# Patient Record
Sex: Male | Born: 1970 | Race: Asian | Hispanic: No | Marital: Married | Smoking: Never smoker
Health system: Southern US, Community
[De-identification: ages and names within clinical notes are randomized; demographics above are authoritative.]

## PROBLEM LIST (undated history)

## (undated) DIAGNOSIS — R569 Unspecified convulsions: Secondary | ICD-10-CM

## (undated) DIAGNOSIS — Z8719 Personal history of other diseases of the digestive system: Secondary | ICD-10-CM

## (undated) DIAGNOSIS — I10 Essential (primary) hypertension: Secondary | ICD-10-CM

## (undated) DIAGNOSIS — K219 Gastro-esophageal reflux disease without esophagitis: Secondary | ICD-10-CM

## (undated) DIAGNOSIS — E785 Hyperlipidemia, unspecified: Secondary | ICD-10-CM

## (undated) HISTORY — DX: Personal history of other diseases of the digestive system: Z87.19

## (undated) HISTORY — DX: Hyperlipidemia, unspecified: E78.5

## (undated) HISTORY — DX: Gastro-esophageal reflux disease without esophagitis: K21.9

## (undated) HISTORY — DX: Essential (primary) hypertension: I10

## (undated) HISTORY — DX: Unspecified convulsions: R56.9

## (undated) HISTORY — PX: OTHER SURGICAL HISTORY: SHX169

---

## 1998-10-07 ENCOUNTER — Emergency Department (HOSPITAL_COMMUNITY): Admission: EM | Admit: 1998-10-07 | Discharge: 1998-10-07 | Payer: Self-pay | Admitting: *Deleted

## 2009-11-11 ENCOUNTER — Encounter: Payer: Self-pay | Admitting: Family Medicine

## 2009-11-28 ENCOUNTER — Encounter: Payer: Self-pay | Admitting: Family Medicine

## 2009-11-28 ENCOUNTER — Ambulatory Visit: Payer: Self-pay | Admitting: Family Medicine

## 2009-11-28 DIAGNOSIS — G40909 Epilepsy, unspecified, not intractable, without status epilepticus: Secondary | ICD-10-CM | POA: Insufficient documentation

## 2009-11-28 DIAGNOSIS — K219 Gastro-esophageal reflux disease without esophagitis: Secondary | ICD-10-CM | POA: Insufficient documentation

## 2009-11-28 DIAGNOSIS — Z8669 Personal history of other diseases of the nervous system and sense organs: Secondary | ICD-10-CM | POA: Insufficient documentation

## 2009-11-28 DIAGNOSIS — R05 Cough: Secondary | ICD-10-CM | POA: Insufficient documentation

## 2009-11-28 DIAGNOSIS — R059 Cough, unspecified: Secondary | ICD-10-CM | POA: Insufficient documentation

## 2009-11-29 ENCOUNTER — Encounter: Payer: Self-pay | Admitting: Family Medicine

## 2009-11-29 ENCOUNTER — Ambulatory Visit: Payer: Self-pay | Admitting: Family Medicine

## 2009-11-30 DIAGNOSIS — E785 Hyperlipidemia, unspecified: Secondary | ICD-10-CM | POA: Insufficient documentation

## 2009-11-30 LAB — CONVERTED CEMR LAB
Albumin: 4.3 g/dL (ref 3.5–5.2)
CO2: 26 meq/L (ref 19–32)
Chloride: 105 meq/L (ref 96–112)
Cholesterol: 212 mg/dL — ABNORMAL HIGH (ref 0–200)
Glucose, Bld: 97 mg/dL (ref 70–99)
HCV Ab: NEGATIVE
LDL Cholesterol: 144 mg/dL — ABNORMAL HIGH (ref 0–99)
Potassium: 4.4 meq/L (ref 3.5–5.3)
RBC: 4.79 M/uL (ref 4.22–5.81)
Sodium: 141 meq/L (ref 135–145)
Total Protein: 7 g/dL (ref 6.0–8.3)
Triglycerides: 124 mg/dL (ref <150)
WBC: 4 10*3/uL (ref 4.0–10.5)

## 2009-12-12 ENCOUNTER — Encounter: Payer: Self-pay | Admitting: Family Medicine

## 2009-12-14 ENCOUNTER — Encounter: Admission: RE | Admit: 2009-12-14 | Discharge: 2009-12-14 | Payer: Self-pay | Admitting: Family Medicine

## 2010-01-17 ENCOUNTER — Ambulatory Visit: Payer: Self-pay | Admitting: Family Medicine

## 2010-03-28 NOTE — Miscellaneous (Signed)
Summary: New Pt Info   Clinical Lists Changes  Observations: Added new observation of SOCIAL HX: Lives in [place]. Married.  Lives with _____  Education: High School Grad Employment: Nail Tech, full time  Tobacco: never Etoh: sometimes beer on weekend Drugs: never Exercise: 3-4x/week, walking   Cell: [phone] (11/11/2009 12:36) Added new observation of FAMILY HX: Father: died at age 39 from __ Mother: age 52,  Siblings: Cancer: (11/11/2009 12:36)        Family History: Father: died at age 62 from __ Mother: age 59,  Siblings: Cancer:  Social History: Lives in [place]. Married.  Lives with _____  Education: High School Grad Employment: Nail Tech, full time  Tobacco: never Etoh: sometimes beer on weekend Drugs: never Exercise: 3-4x/week, walking   Cell: [phone]

## 2010-03-28 NOTE — Assessment & Plan Note (Signed)
Summary: NP,tcb   Vital Signs:  Patient profile:   40 year old male Height:      64 inches Weight:      131.8 pounds BMI:     22.71 Temp:     98.5 degrees F oral Pulse rate:   84 / minute Pulse rhythm:   regular BP sitting:   113 / 81  (right arm) Cuff size:   regular  Vitals Entered By: Loralee Pacas CMA 12-22-09 9:17 AM) CC: new patient   Primary Care Provider:  Angeline Slim MD  CC:  new patient.  History of Present Illness: 40 y/o M here for new patient exam.  He is concerned about Hepatitis because his father died in 2005/07/31 from fulminant hepatitis.  Pt would like to be tested for hepatitis.    Seizure DO: 02/2007, 05/2007  two episodes of seizure.  Was treated Keppra initially but not tolerated so switched to Lamictal.  He was seeing a neurologist in [facility], but transfered to Dr Terrace Arabia when that neurologist retired.  He has been seeing Dr Terrace Arabia, who stopped his Lamictal Aug 2011.   Pt states that at time of seizure he was told the cause was due to being tired, lack of sleep, and working too hard.  REFLUX: has feelings of burning sensation in middle chest.  Lots of burping.  Worse with spicy food.  Sometimes has acid taste in mouth.  No weight loss.    Cough: chronic.  On and off for yrs.  Non productive cough.  Non weight loss, night sweat, hemoptysis, fever, chills. Nonsmoker.  Not on any meds.      Habits & Providers  Alcohol-Tobacco-Diet     Alcohol drinks/day: <1     Tobacco Status: never  Exercise-Depression-Behavior     Does Patient Exercise: yes     Type of exercise: walking     Times/week: 4     Have you felt down or hopeless? no     Have you felt little pleasure in things? no     Depression Counseling: not indicated; screening negative for depression     STD Risk: never     STD Risk Counseling: not indicated-no STD risk noted     Drug Use: never  Current Medications (verified): 1)  Omeprazole 20 Mg Cpdr (Omeprazole) .Marland Kitchen.. 1 Tab By Mouth Each  Morning  Allergies (verified): No Known Drug Allergies  Past History:  Family History: Last updated: Dec 22, 2009 Father: died at age 62 from Hepatitis, CAD s/p CABG, HLD Mother: age 72, arthritis Siblings: 3 brothers, 1 sister: healthy Cancer: no   Social History: Last updated: 12/22/09 [place] emigrated to [place], 1992. Lives in [place]. Married.  Lives with wife and 2 children.    Education: [employer] Grad Employment: Nail Tech, full time  Tobacco: never Etoh: sometimes beer on weekend Drugs: never Exercise: 3-4x/week, walking   Cell: [phone]  Risk Factors: Alcohol Use: <1 (12/22/2009) Exercise: yes (12-22-09)  Risk Factors: Smoking Status: never (12-22-2009)  Past Medical History: Seizure DO   Family History: Father: died at age 65 from Hepatitis, CAD s/p CABG, HLD Mother: age 71, arthritis Siblings: 3 brothers, 1 sister: healthy Cancer: no   Social History: [place] emigrated to [place] 31-Jul[place]1992. Lives in [place]. Married.  Lives with wife and 2 children.    Education: [employer] Employment: [employer], full time  Tobacco: never Etoh: sometimes beer on weekend Drugs: never Exercise: 3-4x/week, walking   Cell: [phone] Status:  never Does Patient Exercise:  yes Drug Use:  never STD Risk:  never  Review of Systems General:  Denies chills, fever, and weight loss. ENT:  Denies hoarseness, nasal congestion, and sore throat. CV:  Denies chest pain or discomfort, fainting, fatigue, lightheadness, near fainting, and swelling of feet. Resp:  Denies chest discomfort, chest pain with inspiration, cough, coughing up blood, morning headaches, sputum productive, and wheezing. GI:  Complains of change in bowel habits; denies abdominal pain, constipation, and diarrhea. GU:  Denies decreased libido, discharge, dysuria, erectile dysfunction, genital sores, hematuria, incontinence, nocturia, urinary frequency, and urinary hesitancy. MS:   Denies joint pain, joint redness, joint swelling, loss of strength, low back pain, mid back pain, muscle aches, muscle , cramps, muscle weakness, stiffness, and thoracic pain. Derm:  Denies changes in color of skin, changes in nail beds, dryness, excessive perspiration, flushing, hair loss, insect bite(s), itching, lesion(s), poor wound healing, and rash. Neuro:  Complains of seizures. Psych:  Denies alternate hallucination ( auditory/visual), anxiety, depression, easily angered, easily tearful, irritability, mental problems, panic attacks, sense of great danger, suicidal thoughts/plans, thoughts of violence, unusual visions or sounds, and thoughts /plans of harming others. Endo:  Denies cold intolerance, excessive hunger, excessive thirst, excessive urination, heat intolerance, polyuria, and weight change. Heme:  Denies abnormal bruising, bleeding, enlarge lymph nodes, fevers, pallor, and skin discoloration. Allergy:  Denies hives or rash, itching eyes, persistent infections, seasonal allergies, and sneezing.  Physical Exam  General:  Well-developed,well-nourished,in no acute distress; alert,appropriate and cooperative throughout examination. vitals reviewed.  Head:  normocephalic and atraumatic.   Mouth:  Oral mucosa and oropharynx without lesions or exudates.   Neck:  No deformities, masses, or tenderness noted. Lungs:  Normal respiratory effort, chest expands symmetrically. Lungs are clear to auscultation, no crackles or wheezes. Heart:  Normal rate and regular rhythm. S1 and S2 normal without gallop, murmur, click, rub or other extra sounds. Abdomen:  Bowel sounds positive,abdomen soft and non-tender without masses, organomegaly or hernias noted. Msk:  normal ROM.   Pulses:  R radial normal, R dorsalis pedis normal, L radial normal, and L dorsalis pedis normal.   Extremities:  No clubbing, cyanosis, edema, or deformity noted with normal full range of motion of all joints.   Neurologic:   alert & oriented X3 and cranial nerves II-XII intact.   Skin:  Intact without suspicious lesions or rashes Cervical Nodes:  No lymphadenopathy noted Inguinal Nodes:  No significant adenopathy   Impression & Recommendations:  Problem # 1:  HEALTH MAINTENANCE EXAM (ICD-V70.0) Assessment New New patient exam.  Will check CBC, FLP, Cmet.    Problem # 2:  SEIZURE DISORDER, HX OF (ICD-V12.49) Assessment: Unchanged States that Dr Terrace Arabia d/c Lamictal in Aug.  Will get records from Dr Terrace Arabia.  Pt signed ROI.    Problem # 3:  COUGH (ICD-786.2) Assessment: New Pt is from [place].  Although cough is intermittent with no fever/chills and lung exam wnl, will get CXR to r/o TB process.    Orders: CXR- 2view (CXR)  Problem # 4:  GERD (ICD-530.81) Assessment: New Presents for months and has not tried any meds. Will try Omeprazole.  Will need to discuss GERD lifestyle changes; small meals, avoid spicy foods/etoh/caffeine, elevate bed while asleep, do not eat prior to bed.   His updated medication list for this problem includes:    Omeprazole 20 Mg Cpdr (Omeprazole) .Marland Kitchen... 1 tab by mouth each morning  Problem # 5:  CONTACT OR EXPOSURE TO  OTHER VIRAL DISEASES (ICD-V01.79) Pt is from [place].  Father died in 2005-07-13 from Hepatitis.  Will check Hepaptitis panel.  Orders: Hep Bs Ab-[facility] ([id number]) Hep Bs Ag-[facility] ([id number]) Hep C Ab-[facility] ([id number])  Complete Medication List: 1)  Omeprazole 20 Mg Cpdr (Omeprazole) .Marland Kitchen.. 1 tab by mouth each morning  Other Orders: Influenza Vaccine NON MCR (56387) CBC-[facility] (56433) Comp Met-[facility] ([id number]) Future Orders: Lipid-[facility] ([id number]) ... 12/09/2010  Patient Instructions: 1)  Please schedule a follow-up appointment in 1 month for reflux.  2)  Please make appt for cholesterol check.  This has to be fasting, so no food or drink after midnight the night before appointment. 3)  New medicine: omeprazole 20mg  daily  Prescriptions: OMEPRAZOLE 20 MG CPDR  (OMEPRAZOLE) 1 tab by mouth each morning  #30 x 3   Entered and Authorized by:   Angeline Slim MD   Signed by:   Angeline Slim MD on 11/28/2009   Method used:   Electronically to        [facility] [address] (retail)       [phone]       [place], [place]  [address]       Ph: [phone]       Fax: [phone]   RxID:   [id number]    Immunizations Administered:  Influenza Vaccine # 1:    Vaccine Type: Fluvax Non-MCR    Site: left deltoid    Mfr: GlaxoSmithKline    Dose: 0.5 ml    Route: IM    Given by: Loralee Pacas CMA    Exp. Date: 08/23/2010    Lot #: [id number]    VIS given: 09/20/09 version given November 28, 2009.  Flu Vaccine Consent Questions:    Do you have a history of severe allergic reactions to this vaccine? no    Any prior history of allergic reactions to egg and/or gelatin? no    Do you have a sensitivity to the preservative Thimersol? no    Do you have a past history of Guillan-Barre Syndrome? no    Do you currently have an acute febrile illness? no    Have you ever had a severe reaction to latex? no    Vaccine information given and explained to patient? yes

## 2010-03-28 NOTE — Miscellaneous (Signed)
Summary: Pmhx Re Seizure DO  Clinical Lists Changes  Observations: Added new observation of PAST MED HX: -History of Seizure DO (childhooold seizures age 40, seizure free until Jan 2009; one event, then another event April 2009.  Saw Dr Philis Fendt, normal MRI brain, normal EEG 06/2009.  Now seeing Dr Terrace Arabia.  Was on Lamictal 25mg , now off since seizure free for 2 yrs) (12/12/2009 8:48) Added new observation of PRIMARY MD: Worthy Boschert MD (12/12/2009 8:48)       Past History:  Past Medical History: -History of Seizure DO (childhooold seizures age 69, seizure free until Jan 2009; one event, then another event April 2009.  Saw Dr Philis Fendt, normal MRI brain, normal EEG 06/2009.  Now seeing Dr Terrace Arabia.  Was on Lamictal 25mg , now off since seizure free for 2 yrs)

## 2010-03-28 NOTE — Miscellaneous (Signed)
Summary: Release of Info  Release of Info   Imported By: Abundio Miu 12/15/2009 10:40:33  _____________________________________________________________________  External Attachment:    Type:   Image     Comment:   External Document

## 2010-03-28 NOTE — Assessment & Plan Note (Signed)
Summary: f/u,df   Vital Signs:  Patient profile:   40 year old male Weight:      125.6 pounds BMI:     21.64 Pulse rate:   80 / minute BP sitting:   114 / 74  (right arm)  Vitals Entered By: Arlyss Repress CMA, (January 17, 2010 8:51 AM) CC: f/up cholesterol meds Is Patient Diabetic? No Pain Assessment Patient in pain? no        Primary Care Alacia Rehmann:  Cat Ta MD  CC:  f/up cholesterol meds.  History of Present Illness: 40 y/o M here for f/u of   HYPERLIPIDEMIA: Med: Simva 20mg   Compliance: yes  Prob taking med: no  Muscle aches: no Abd  pain:no  GERD:  Med: Omeprazole 20mg  daily Compliance: yes Symptoms: improved since beng on the medication.  No more sensations of burning in chest, food stuck in throat, or belching. Weight loss: yes  Abd pain: no, diarrhea: no, constipation: no  chest pain:no  Weight loss: 6 lbs since last visit, 1 1/2 months Been hanging out with friends drinking, drinks 4-5 bottles of beer each night, about 3-4 times per week.  Pt would come home and go to bed vs eat dinner.  Wife states that he likes to be in company of his friends, so when they drink he drinks also.   Not eating much since he is drinking more frequently.   No SI/HI.  Depression screening questions negative.   No change in bowel habits, no melena, hemoptysis.      .  Habits & Providers  Alcohol-Tobacco-Diet     Tobacco Status: never  Current Medications (verified): 1)  Omeprazole 20 Mg Cpdr (Omeprazole) .Marland Kitchen.. 1 Tab By Mouth Each Morning 2)  Simvastatin 20 Mg Tabs (Simvastatin) .Marland Kitchen.. 1 Tab By Mouth At Bedtime For Cholesterol  Allergies (verified): No Known Drug Allergies  Past History:  Family History: Last updated: December 13, 2009 Father: died at age 35 from Hepatitis, CAD s/p CABG, HLD Mother: age 56, arthritis Siblings: 3 brothers, 1 sister: healthy Cancer: no   Social History: Last updated: 12-13-09 [place] emigrated to [place]. Lives in [place].  Married.  Lives with wife and 2 children.    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  Saw Dr Philis Fendt, normal MRI brain, normal EEG 06/2009.  Now seeing Dr Terrace Arabia.  Was on Lamictal 25mg , now off since seizure free for 2 yrs) -HLD -Reflux  Review of Systems       per hpi   Physical Exam  General:  Well-developed,well-nourished,in no acute distress; alert,appropriate and cooperative throughout examination. vital reviewed Lungs:  Normal respiratory effort, chest expands symmetrically. Lungs are clear to auscultation, no crackles or wheezes. Heart:  Normal rate and regular rhythm. S1 and S2 normal without gallop, murmur, click, rub or other extra sounds. Abdomen:  Bowel sounds positive,abdomen soft and non-tender without masses, organomegaly or hernias noted. Neurologic:  alert & oriented X3.   Psych:  Cognition and judgment appear intact. Alert and cooperative with normal attention span and concentration. No apparent delusions, illusions, hallucinationsnormally interactive, good eye contact, not anxious appearing, not depressed appearing, not agitated, not  suicidal, and not homicidal.     Impression & Recommendations:  Problem # 1:  HYPERLIPIDEMIA (ICD-272.4) Assessment Unchanged  Taking Simva 20mg  without any problems.  Will continue on this dose.  Recheck FLP in one year.    His updated medication list for this problem includes:    Simvastatin 20 Mg Tabs (Simvastatin) .Marland Kitchen... 1 tab by mouth at bedtime for cholesterol  Orders: [facility]- Est  Level 4 (91478)  Problem # 2:  GERD  (ICD-530.81) Assessment: Improved  Reflux symptoms much better.  Pt has stopped taking omeprazole 3 days ok and symptoms better.  I've refilled the medicine for pt to have when symptoms resumed.  Pt has recent weight loss (see next), but likely not related to gerd.   His updated medication list for this problem includes:    Omeprazole 20 Mg Cpdr (Omeprazole) .Marland Kitchen... 1 tab by mouth each morning  Orders: [facility]- Est  Level 4 (29562)  Problem # 3:  WEIGHT LOSS, RECENT (ICD-783.21) Assessment: New  Weight loss of 6 lbs since last visit.  BMI still wnl.  From history pt has been binge drinking with friends and not eating regular meals.  No abd pain, diarrhea, vomiting, nausea.  Cmet, CBC and Hep panel from Oct negative.  CAGE questions negative.  Discussed decreasing alcohol consumption.  Pt to monitor weight and to rtc in 1 month if weight loss continues.  Discussed increased risks of cancer given that pt has GERD and consumes moderate amt of alcohol.    Orders: [facility]- Est  Level 4 (13086)  Complete Medication List: 1)  Omeprazole 20 Mg Cpdr (Omeprazole) .Marland Kitchen.. 1 tab by mouth each morning 2)  Simvastatin 20 Mg Tabs (Simvastatin) .Marland Kitchen.. 1 tab by mouth at bedtime for cholesterol Prescriptions: OMEPRAZOLE 20 MG CPDR (OMEPRAZOLE) 1 tab by mouth each morning  #30 x 6   Entered and Authorized by:   Angeline Slim MD   Signed by:   Angeline Slim MD on 01/17/2010   Method used:   Electronically to        [facility] [address] (retail)       [phone]       [place], [place]  [address]       Ph: [phone]       Fax: [phone]   RxID:   [id number]    Orders Added: 1)  [facility]- Est  Level 4 [43329]

## 2011-01-02 ENCOUNTER — Encounter: Payer: Self-pay | Admitting: Emergency Medicine

## 2011-01-02 ENCOUNTER — Ambulatory Visit (INDEPENDENT_AMBULATORY_CARE_PROVIDER_SITE_OTHER): Payer: BC Managed Care – PPO | Admitting: Emergency Medicine

## 2011-01-02 VITALS — BP 110/70 | HR 72 | Ht 64.0 in | Wt 132.5 lb

## 2011-01-02 DIAGNOSIS — E785 Hyperlipidemia, unspecified: Secondary | ICD-10-CM

## 2011-01-02 DIAGNOSIS — K219 Gastro-esophageal reflux disease without esophagitis: Secondary | ICD-10-CM

## 2011-01-02 DIAGNOSIS — Z23 Encounter for immunization: Secondary | ICD-10-CM

## 2011-01-02 DIAGNOSIS — Z0000 Encounter for general adult medical examination without abnormal findings: Secondary | ICD-10-CM | POA: Insufficient documentation

## 2011-01-02 MED ORDER — OMEPRAZOLE 20 MG PO CPDR: 20.0000 mg | DELAYED_RELEASE_CAPSULE | Freq: Every day | ORAL | Status: DC | PRN

## 2011-01-02 NOTE — Assessment & Plan Note (Signed)
Will get flu shot today.  Will also check LFTs given his concern for liver disease after his father's liver cancer.

## 2011-01-02 NOTE — Assessment & Plan Note (Signed)
Has not been taking simvastatin for the last 6 months.  Given last years lipid panel with LDL of 144 and no risk factors, I will discontinue the simvastatin for now.  A fasting lipid panel and has been ordered and he will have that drawn this week.

## 2011-01-02 NOTE — Progress Notes (Signed)
  Subjective:    Patient ID: Logan Black, male    DOB: 1970/07/30, 40 y.o.   MRN: [id number]  HPI Logan Black is a healthy 40 year old man who comes in today for a physical exam. He has no acute concerns today or symptoms.  He does have a history of heartburn for which he takes omeprazole as needed. He is currently asymptomatic however would like a prescription available should he develop symptoms.  Last year he was diagnosed with hyperlipidemia based on a fasting lipid panel total cholesterol 212 and LDL of 144. At that time he was started on simvastatin 20 mg daily. He has not been taking this medication for the last 6 months. He has no history of hypertension, coronary artery disease, and there is no family history of coronary artery disease.  He does have some concerns about his liver due to the fact that his father recently died from liver cancer. He would like his liver checked today.   Review of Systems Please see history of present illness otherwise review of systems is negative.    Objective:   Physical Exam Filed Vitals:   01/02/11 1325  BP: 110/70  Pulse: 72   General: Awake, alert, well-appearing, no distress HEENT: Sclera are white, moist mucous membranes, no cervical lymphadenopathy CV: Regular rate and rhythm, no murmurs, good peripheral pulses Pulm: Clear to auscultation bilaterally Abdomen: Positive bowel sounds, soft, nondistended, nontender, no hepatosplenomegaly Extremities: No peripheral edema Neuro: Normal gait      Assessment & Plan:

## 2011-01-02 NOTE — Assessment & Plan Note (Signed)
Currently asymptomatic; however would like to have a prescription available in case he develops symptoms. Will refill his omeprazole.

## 2011-01-02 NOTE — Patient Instructions (Signed)
He was very nice to meet you!  Her physical exam today was completely normal and you are in excellent health. I am going to have you return for a lab appointment this week to check your liver function and your fasting lipid panel. Please do not eat or drink anything other than water for 8 hours prior to your lab appointment. You can schedule the lab appointment for any morning this week on your way out.  I am going to stop the simvastatin. If your cholesterol is still elevated, I will contact you about possibly restarting the medicine. You should receive a letter in the next 2-4 weeks with the lab results. If he do not receive a letter please call the clinic at [phone] for your results.  Please follow up with me in one year for an annual exam or sooner as needed.

## 2011-01-05 ENCOUNTER — Other Ambulatory Visit: Payer: BC Managed Care – PPO

## 2011-01-05 DIAGNOSIS — E785 Hyperlipidemia, unspecified: Secondary | ICD-10-CM

## 2011-01-05 LAB — HEPATIC FUNCTION PANEL
ALT: 20 U/L (ref 0–53)
AST: 19 U/L (ref 0–37)
Bilirubin, Direct: 0.1 mg/dL (ref 0.0–0.3)

## 2011-01-05 LAB — LIPID PANEL
Cholesterol: 193 mg/dL (ref 0–200)
HDL: 40 mg/dL (ref >39)
Total CHOL/HDL Ratio: 4.8 Ratio

## 2011-01-05 NOTE — Progress Notes (Signed)
Labs done today Logan Black 

## 2011-01-08 ENCOUNTER — Encounter: Payer: Self-pay | Admitting: Emergency Medicine

## 2012-01-02 ENCOUNTER — Ambulatory Visit (INDEPENDENT_AMBULATORY_CARE_PROVIDER_SITE_OTHER): Payer: BC Managed Care – PPO | Admitting: Emergency Medicine

## 2012-01-02 ENCOUNTER — Encounter: Payer: Self-pay | Admitting: Emergency Medicine

## 2012-01-02 VITALS — BP 123/83 | HR 78 | Ht 64.0 in | Wt 134.0 lb

## 2012-01-02 DIAGNOSIS — K219 Gastro-esophageal reflux disease without esophagitis: Secondary | ICD-10-CM

## 2012-01-02 DIAGNOSIS — Z23 Encounter for immunization: Secondary | ICD-10-CM

## 2012-01-02 DIAGNOSIS — Z0000 Encounter for general adult medical examination without abnormal findings: Secondary | ICD-10-CM

## 2012-01-02 LAB — CBC
HCT: 43.2 % (ref 39.0–52.0)
Hemoglobin: 14.5 g/dL (ref 13.0–17.0)
MCH: 30 pg (ref 26.0–34.0)
MCV: 89.3 fL (ref 78.0–100.0)
RBC: 4.84 MIL/uL (ref 4.22–5.81)

## 2012-01-02 LAB — COMPREHENSIVE METABOLIC PANEL
Alkaline Phosphatase: 53 U/L (ref 39–117)
Creat: 0.96 mg/dL (ref 0.50–1.35)
Glucose, Bld: 79 mg/dL (ref 70–99)
Sodium: 140 mEq/L (ref 135–145)
Total Bilirubin: 0.4 mg/dL (ref 0.3–1.2)
Total Protein: 6.8 g/dL (ref 6.0–8.3)

## 2012-01-02 MED ORDER — OMEPRAZOLE 20 MG PO CPDR: 20.0000 mg | DELAYED_RELEASE_CAPSULE | Freq: Every day | ORAL | Status: DC | PRN

## 2012-01-02 NOTE — Assessment & Plan Note (Signed)
Intermittent heartburn.  Will refill omeprazole to take as needed.

## 2012-01-02 NOTE — Patient Instructions (Addendum)
It was nice to see you! We are going to check some labs today.  I will send a letter with the results in the next 2 weeks. Follow up in 1 year for annual exam or sooner if needed.

## 2012-01-02 NOTE — Addendum Note (Signed)
Addended by: Jennette Bill on: 01/02/2012 10:49 AM   Modules accepted: Orders

## 2012-01-02 NOTE — Assessment & Plan Note (Signed)
Will get flu and TDaP today.  BP, LDL, glucose, BMI all at goal.  Discussed getting 30 minutes of exercise 3x per week.  Follow up in 1 year for annual exam.

## 2012-01-02 NOTE — Progress Notes (Signed)
  Subjective:    Patient ID: Logan Black, male    DOB: 27-Jun-1970, 41 y.o.   MRN: [id number]  HPI Logan Black is here for annual exam and lab work.  He has no acute concerns today.  Would like the flu shot and labs to assess his liver function.  Eating a healthy diet with fruits and vegetables.  Does not exercise.  I have reviewed and updated the following as appropriate: allergies, current medications, past family history, past medical history, past social history, past surgical history and problem list PMHx: removed hyperlipidemia as cholesterol is normal  FHx: liver cancer in father SHx: never smoker   Review of Systems Patient Information Form: Screening and ROS  Do you feel safe in relationships? yes PHQ-2:negative  Review of Symptoms  General:  Negative for nexplained weight loss, fever Skin: Negative for new or changing mole, sore that won't heal HEENT: Negative for trouble hearing, trouble seeing, ringing in ears, mouth sores, hoarseness, change in voice, dysphagia. CV:  Negative for chest pain, dyspnea, edema, palpitations Resp: Negative for cough, dyspnea, hemoptysis GI: Negative for nausea, vomiting, diarrhea, constipation, abdominal pain, melena, hematochezia. GU: Negative for dysuria, incontinence, urinary hesitance, hematuria, vaginal or penile discharge, polyuria, sexual difficulty, lumps in testicle or breasts MSK: Negative for muscle cramps or aches, joint pain or swelling Neuro: Negative for headaches, weakness, numbness, dizziness, passing out/fainting Psych: Negative for depression, anxiety, memory problems      Objective:   Physical Exam BP 123/83  Pulse 78  Ht 5\' 4"  (1.626 m)  Wt 134 lb (60.782 kg)  BMI 23.00 kg/m2 Gen: alert, cooperative, NAD HEENT: AT/[place], sclera white, PERRL, EOMI, MMM, no pharyngeal erythema or exudate, external ears normal Neck: supple, no LAD, trachea midline CV: RRR, no murmurs Pulm: CTAB, no wheezes or rales Ext: no edema,  WWP Neuro: alert, oriented, strength 5/5 in all extremities, gait normal Skin: no rashes or lesions      Assessment & Plan:

## 2012-01-04 ENCOUNTER — Encounter: Payer: Self-pay | Admitting: Emergency Medicine

## 2012-10-23 ENCOUNTER — Encounter: Payer: Self-pay | Admitting: Family Medicine

## 2012-10-23 ENCOUNTER — Ambulatory Visit (INDEPENDENT_AMBULATORY_CARE_PROVIDER_SITE_OTHER): Payer: BC Managed Care – PPO | Admitting: Family Medicine

## 2012-10-23 VITALS — BP 131/89 | HR 84 | Temp 97.6°F | Ht 64.0 in | Wt 130.0 lb

## 2012-10-23 DIAGNOSIS — L02414 Cutaneous abscess of left upper limb: Secondary | ICD-10-CM

## 2012-10-23 DIAGNOSIS — IMO0002 Reserved for concepts with insufficient information to code with codable children: Secondary | ICD-10-CM

## 2012-10-23 MED ORDER — OXYCODONE-ACETAMINOPHEN 5-325 MG PO TABS: 1.0000 | ORAL_TABLET | ORAL | Status: DC | PRN

## 2012-10-23 MED ORDER — CLINDAMYCIN HCL 300 MG PO CAPS: 300.0000 mg | ORAL_CAPSULE | Freq: Three times a day (TID) | ORAL | Status: DC

## 2012-10-23 NOTE — Patient Instructions (Signed)
Abscess An abscess is an infected area that contains a collection of pus and debris.It can occur in almost any part of the body. An abscess is also known as a furuncle or boil. CAUSES  An abscess occurs when tissue gets infected. This can occur from blockage of oil or sweat glands, infection of hair follicles, or a minor injury to the skin. As the body tries to fight the infection, pus collects in the area and creates pressure under the skin. This pressure causes pain. People with weakened immune systems have difficulty fighting infections and get certain abscesses more often.  SYMPTOMS Usually an abscess develops on the skin and becomes a painful mass that is red, warm, and tender. If the abscess forms under the skin, you may feel a moveable soft area under the skin. Some abscesses break open (rupture) on their own, but most will continue to get worse without care. The infection can spread deeper into the body and eventually into the bloodstream, causing you to feel ill.  DIAGNOSIS  Your caregiver will take your medical history and perform a physical exam. A sample of fluid may also be taken from the abscess to determine what is causing your infection. TREATMENT  Your caregiver may prescribe antibiotic medicines to fight the infection. However, taking antibiotics alone usually does not cure an abscess. Your caregiver may need to make a small cut (incision) in the abscess to drain the pus. In some cases, gauze is packed into the abscess to reduce pain and to continue draining the area. HOME CARE INSTRUCTIONS   Only take over-the-counter or prescription medicines for pain, discomfort, or fever as directed by your caregiver.  If you were prescribed antibiotics, take them as directed. Finish them even if you start to feel better.  If gauze is used, follow your caregiver's directions for changing the gauze.  To avoid spreading the infection:  Keep your draining abscess covered with a  bandage.  Wash your hands well.  Do not share personal care items, towels, or whirlpools with others.  Avoid skin contact with others.  Keep your skin and clothes clean around the abscess.  Keep all follow-up appointments as directed by your caregiver. SEEK MEDICAL CARE IF:   You have increased pain, swelling, redness, fluid drainage, or bleeding.  You have muscle aches, chills, or a general ill feeling.  You have a fever. MAKE SURE YOU:   Understand these instructions.  Will watch your condition.  Will get help right away if you are not doing well or get worse. Document Released: 11/22/2004 Document Revised: 08/14/2011 Document Reviewed: 04/27/2011 [employer] Patient Information 2014 [employer].  

## 2012-10-23 NOTE — Progress Notes (Addendum)
  Subjective:    Patient ID: Logan Black, male    DOB: 1971-01-03, 42 y.o.   MRN: [id number]  HPI 42 y.o. M presents with 4 days of worsening pain on right arm following a sting from a hornet. Pt states that since that time he has had worsening pain but no fevers. Wife attempted to drain yesterday but was unable to get anything more than blood out from wound.   No fevers, weakness, numbness or other concerning symptoms at this time.   Review of Systems  Constitutional: Negative for activity change and appetite change.  Respiratory: Negative for cough.   Cardiovascular: Negative for chest pain.  Skin: Positive for color change and wound. Negative for pallor and rash.  Neurological: Negative for headaches.       Objective:   Physical Exam  Constitutional: He appears well-developed and well-nourished. No distress.  Skin: Skin is warm and dry. Rash (large fluctuant edematous lesion on right upper arm. ) noted. He is not diaphoretic. There is erythema. No pallor.   Left upper arm: erythematous fluctuant abscess TTP, no active drainag       Assessment & Plan:  Skin abscess 2/2 insect bite - I&D in clinic - Clindamycin 300mg  TID for 10days for MRSA coverage - f/u for wound packing and in 7-10 days to ensure resolution.  Procedure: Incision and drainage of abscess Consent: The risks and benefits of the procedure were discussed with the patient. Risks include acute and chronic pain, spread of infection or new infection (minimized by sterile technique), bleeding (internal and external), and need for subsequent procedure to correct a poor cosmetic result or to further treat symptoms. Rarely, damage to internal structures may occur requiring subsequent procedures or, exceedingly rare, long term complications. Benefits may include symptom relief, evacuation of infection, and potentially diagnosis. The alternatives were explained to the patient including: not doing procedure or postponing  procedure. The disadvantages to not doing the procedure were discussed with the patent, including the persistence of the abscess or failure to determine the species of bacteria. The written consent form has been signed and placed in the patient's medical record The patient voiced understanding of the procedure and agreed to proceed. Indication: Presence of abscess Physicians: Dr. Jolyn Lent, MD and [facility] Staff Description in detail: A timeout was completed before the start of the procedure - the site was  verified and documented in the chart. Injected 10  mL of 1% Lidocaine with epinephrine into abscess area. With 11 blade, made elliptical excision and removed small amount of skin. Drained 5mL of purulent fluid. Probed wound for loculations. Irrigated wound w/ 30 mL of normal saline. Packed wound w/ iodoform gauze. Dressing placed. EBL: Minimal; <57mL Complications: None Instructions to patient: Return to clinic vs. ER, depending on severity, with fevers,  erythema, increasing pain, or reacculumulation of purulent material. Expect mild soreness for at least 3-5 days.   Call clinic with other questions. Follow up plan: Pt to pack at home, to follow up in 7-10days in clinic to ensure resolution  Tawana Scale, MD OB Fellow  Antibiotic Organism Organism Organism METHICILLIN RESISTANT STAPHYLOCOCCUS AUREUS CEFAZOLIN R CIPROFLOXACIN <=0.5 S Final CLINDAMYCIN <=0.25 S Final ERYTHROMYCIN <=0.25 S Final GENTAMICIN <=0.5 S Final LEVOFLOXACIN 0.25 S Final LINEZOLID 2 S Final OXACILLIN >=4 R Final PENICILLIN >=0.5 R Final RIFAMPIN <=0.5 S Final TETRACYCLINE <=1 S Final TRIMETH/SULFA <=10 S Final VANCOMYCIN <=0.5 S Final

## 2012-10-24 ENCOUNTER — Encounter: Payer: Self-pay | Admitting: Family Medicine

## 2012-10-24 ENCOUNTER — Ambulatory Visit (INDEPENDENT_AMBULATORY_CARE_PROVIDER_SITE_OTHER): Payer: BC Managed Care – PPO | Admitting: Family Medicine

## 2012-10-24 VITALS — BP 114/90 | HR 109 | Temp 98.3°F | Wt 130.4 lb

## 2012-10-24 DIAGNOSIS — IMO0002 Reserved for concepts with insufficient information to code with codable children: Secondary | ICD-10-CM

## 2012-10-24 NOTE — Assessment & Plan Note (Signed)
Less than 24 hours s/p I&D. Packing not removed since it has only been one day. Area re-dressed with nonadherent gauze and coban. Continue clinda. F/u on Sept 2 for recheck. If he has increased pain, redness, drainage, odor or if he feels ill, he should be seen over the weekend. Pt agrees.

## 2012-10-24 NOTE — Patient Instructions (Addendum)
It was good to see you. I am glad your arm is a little better.  Keep taking your Clindamycin (antibiotics) THREE TIMES per day. You can take the Percocet as needed for pain.  Please make an appointment on Tuesday to have the packing changed.  Amyri Frenz M. Ugochi Henzler, M.D.

## 2012-10-24 NOTE — Progress Notes (Signed)
Patient ID: Logan Black, male   DOB: 02-08-1971, 42 y.o.   MRN: [id number]  [facility] Logan Alen M. Caydence Koenig, MD Phone: [phone]   Subjective: HPI: Patient is a 42 y.o. male presenting to clinic today for wound check for I&D and dressing change  Had I&D done yesterday for abscess of arm. Started Clinda last night. Only concern is some increased pain towards elbow. He denies any odor or excessive bleeding. No other concerns.  History Reviewed: Non smoker.  ROS: Please see HPI above.  Objective: Office vital signs reviewed. BP 114/90  Pulse 109  Temp(Src) 98.3 F (36.8 C) (Oral)  Wt 130 lb 6.4 oz (59.149 kg)  BMI 22.37 kg/m2  Physical Examination:  General: Awake, alert. NAD HEENT: Atraumatic, normocephalic Extremities: Large ABD pain covering arm with tape. Erythema where tape was removed. 4cm erythema surrounding open/packed wound on anterior aspect of right arm proximal to antecubital fossa. Mild induration of the area. Packing has bloody drainage but no pus. No red streaking of skin. Neuro: Grossly intact  Assessment: 42 y.o. male I&D check  Plan: See Problem List and After Visit Summary

## 2012-10-26 LAB — WOUND CULTURE

## 2012-10-28 ENCOUNTER — Ambulatory Visit (INDEPENDENT_AMBULATORY_CARE_PROVIDER_SITE_OTHER): Payer: BC Managed Care – PPO | Admitting: Family Medicine

## 2012-10-28 ENCOUNTER — Encounter: Payer: Self-pay | Admitting: Family Medicine

## 2012-10-28 VITALS — BP 122/86 | HR 80 | Temp 99.5°F | Wt 130.0 lb

## 2012-10-28 DIAGNOSIS — IMO0002 Reserved for concepts with insufficient information to code with codable children: Secondary | ICD-10-CM

## 2012-10-28 NOTE — Assessment & Plan Note (Signed)
Still with some purulent drainage when packing removed. Will repack today and f/u in 48 hours to have it removed. If no more pus, can leave open and do routine wound care.

## 2012-10-28 NOTE — Progress Notes (Signed)
Patient ID: Mare Ludtke, male   DOB: Apr 07, 1970, 42 y.o.   MRN: [id number]  [name] Graciano Batson M. Lesleyann Fichter, MD Phone: [phone]   Subjective: HPI: Patient is a 42 y.o. male presenting to clinic today for wound check.  Was seen last week for abscess of right upper arm. S/p I&D with packing. Overall doing well. No pain, no odor, no drainage. Changing dressing regularly. Does report some itching (?tape allergies)  History Reviewed: Non-smoker.  ROS: Please see HPI above.  Objective: Office vital signs reviewed. BP 122/86  Pulse 80  Temp(Src) 99.5 F (37.5 C) (Oral)  Wt 130 lb (58.968 kg)  BMI 22.3 kg/m2  Physical Examination:  General: Awake, alert. NAD.  HEENT: Atraumatic, normocephalic. MMM Extremities: No edema. Well healing open wound on right upper extremity. 1cm elliptical incision with small amount of purulent drainage and good granulation tissue Neuro: Grossly intact  Assessment: 42 y.o. male wound check  Plan: See Problem List and After Visit Summary

## 2012-10-28 NOTE — Patient Instructions (Addendum)
I am glad you are feeling better.  We will see you back on Thursday to have the packing removed. Continue taking your antibiotics.  If you have any increased pain, redness, swelling or fever please let us know.  Semisi Biela M. Dajuana Palen, M.D.

## 2012-10-30 ENCOUNTER — Ambulatory Visit (INDEPENDENT_AMBULATORY_CARE_PROVIDER_SITE_OTHER): Payer: BC Managed Care – PPO | Admitting: Family Medicine

## 2012-10-30 ENCOUNTER — Encounter: Payer: Self-pay | Admitting: Family Medicine

## 2012-10-30 VITALS — BP 123/85 | HR 106 | Temp 98.7°F | Wt 131.0 lb

## 2012-10-30 DIAGNOSIS — IMO0002 Reserved for concepts with insufficient information to code with codable children: Secondary | ICD-10-CM

## 2012-10-30 NOTE — Patient Instructions (Addendum)
Incision and Drainage Care After Refer to this sheet in the next few weeks. These instructions provide you with information on caring for yourself after your procedure. Your caregiver may also give you more specific instructions. Your treatment has been planned according to current medical practices, but problems sometimes occur. Call your caregiver if you have any problems or questions after your procedure. HOME CARE INSTRUCTIONS   Finish your antibiotics  Let warm water wash over your arm twice daily in the shower. Avoid soaps and lotions to the area.  Only take over-the-counter or prescription medicines for pain, discomfort, or fever as directed by your caregiver.  Keep all follow-up appointments as directed by your caregiver. (We will see you back on Sept 15 for a recheck, or sooner if you have any problems.)  Change any bandages (dressings) as directed by your caregiver. Replace old dressings with clean dressings.  Wash your hands before and after caring for your wound. You will receive specific instructions for cleansing and caring for your wound.  SEEK MEDICAL CARE IF:   You have increased pain, swelling, or redness around the wound.  You have increased drainage, smell, or bleeding from the wound.  You have muscle aches, chills, or you feel generally sick.  You have a fever. MAKE SURE YOU:   Understand these instructions.  Will watch your condition.  Will get help right away if you are not doing well or get worse. Document Released: 05/07/2011 Document Reviewed: 05/07/2011 [facility] Patient Information 2014 [place], [place].

## 2012-10-30 NOTE — Assessment & Plan Note (Signed)
Resolving. Continue Clinda. Wound left open to heal by secondary intent today. F/u in 10 days or sooner if needed.

## 2012-10-30 NOTE — Progress Notes (Signed)
Patient ID: Logan Black, male   DOB: 1971/02/27, 42 y.o.   MRN: [id number]  [name] Amber M. Hairford, MD Phone: [phone]   Subjective: HPI: Patient is a 42 y.o. male presenting to clinic today for wound check.  Patient is s/p I&D with repeat packing 48 hours ago. He reports no pain or odor, but does have itching around the wound. Still taking antibiotics, but almost finished. No complaints today.  History Reviewed: Non smoker.  ROS: Please see HPI above.  Objective: Office vital signs reviewed. BP 123/85  Pulse 106  Temp(Src) 98.7 F (37.1 C) (Oral)  Wt 131 lb (59.421 kg)  BMI 22.47 kg/m2  Physical Examination:  General: Awake, alert. NAD Extremities: No edema. 1cm open wound on upper right arm proximal to elbow. Packing without purulent drainage. Base of wound with granulation tissue. No surrounding erythema.  Neuro: Grossly intact  Assessment: 42 y.o. male wound recheck s/p I&D  Plan: See Problem List and After Visit Summary

## 2012-11-11 ENCOUNTER — Ambulatory Visit: Payer: BC Managed Care – PPO | Admitting: Family Medicine

## 2012-11-27 ENCOUNTER — Encounter: Payer: Self-pay | Admitting: Emergency Medicine

## 2012-11-27 ENCOUNTER — Ambulatory Visit (INDEPENDENT_AMBULATORY_CARE_PROVIDER_SITE_OTHER): Payer: BC Managed Care – PPO | Admitting: Emergency Medicine

## 2012-11-27 VITALS — BP 121/85 | HR 73 | Ht 64.0 in | Wt 131.6 lb

## 2012-11-27 DIAGNOSIS — R197 Diarrhea, unspecified: Secondary | ICD-10-CM | POA: Insufficient documentation

## 2012-11-27 DIAGNOSIS — Z0000 Encounter for general adult medical examination without abnormal findings: Secondary | ICD-10-CM | POA: Insufficient documentation

## 2012-11-27 LAB — COMPREHENSIVE METABOLIC PANEL
ALT: 23 U/L (ref 0–53)
AST: 22 U/L (ref 0–37)
Albumin: 4.2 g/dL (ref 3.5–5.2)
Alkaline Phosphatase: 51 U/L (ref 39–117)
Calcium: 9.8 mg/dL (ref 8.4–10.5)
Chloride: 104 mEq/L (ref 96–112)
Potassium: 4.3 mEq/L (ref 3.5–5.3)
Sodium: 139 mEq/L (ref 135–145)

## 2012-11-27 LAB — LIPID PANEL: LDL Cholesterol: 130 mg/dL — ABNORMAL HIGH (ref 0–99)

## 2012-11-27 MED ORDER — PSYLLIUM 30.9 % PO POWD: 1.0000 [IU] | Freq: Every day | ORAL | Status: DC

## 2012-11-27 NOTE — Progress Notes (Signed)
  Subjective:    Patient ID: Logan Black, male    DOB: 1971/01/02, 42 y.o.   MRN: [id number]  HPI Aslan Romanoski is here for an annual exam.  I have reviewed and updated the following as appropriate: allergies, current medications, past family history, past medical history, past social history, past surgical history and problem list PMHx: none  FHx: dad with liver cancer SHx: non smoker  Diarrhea He reports loose to watery stools 3-4x a day for the last 4-5 weeks.  He states that about 1-2 hours after eating, he needs to use the restroom.  Denies any blood or mucous in the stool.  No abdominal pain, tenesmus, nausea, vomiting.  No weight loss.     Review of Systems Patient Information Form: Screening and ROS Do you feel safe in relationships? yes PHQ-2:negative  Review of Symptoms  General:  Negative for nexplained weight loss, fever Skin: Negative for new or changing mole, sore that won't heal HEENT: Negative for trouble hearing, trouble seeing, ringing in ears, mouth sores, hoarseness, change in voice, dysphagia. CV:  Negative for chest pain, dyspnea, edema, palpitations Resp: Negative for cough, dyspnea, hemoptysis GI: Negative for nausea, vomiting, + diarrhea, constipation, abdominal pain, melena, hematochezia. GU: Negative for dysuria, incontinence, urinary hesitance, hematuria, vaginal or penile discharge, polyuria, sexual difficulty, lumps in testicle or breasts MSK: Negative for muscle cramps or aches, joint pain or swelling Neuro: Negative for headaches, weakness, numbness, dizziness, passing out/fainting Psych: Negative for depression, anxiety, memory problems      Objective:   Physical Exam BP 121/85  Pulse 73  Ht 5\' 4"  (1.626 m)  Wt 131 lb 9.6 oz (59.693 kg)  BMI 22.58 kg/m2 Gen: alert, cooperative, NAD HEENT: AT/Nenzel, sclera white, PERRL, MMM Neck: supple, no LAD CV: RRR, no murmurs Pulm: CTAB, no wheezes or rales Abd: +BS, soft, NTND Ext: no edema, 2+ DP pulses  bilaterally Neuro: no focal deficits Skin: no rashes     Assessment & Plan:

## 2012-11-27 NOTE — Assessment & Plan Note (Signed)
I suspect this is functional. No symptoms consistent with infectious or inflammatory cause. Discussed starting daily fiber with Metamucil. Follow up in 1 month if not improving on the Metamucil - would get stool leukocytes and stool culture at that time.

## 2012-11-27 NOTE — Patient Instructions (Addendum)
It was nice to see you!  Please get Metamucil at the drug store - you do not need a prescription for this.  It is a powder that you mix in a glass of water and take once a day. This may help with the diarrhea.  Things to do to Keep yourself Healthy  - Exercise at least 30-45 minutes a day,  3-4 days a week.  - Eat a low-fat diet with lots of fruits and vegetables, up to 7-9 servings per day. - Seatbelts can save your life. Wear them always. - Smoke detectors on every level of your home, check batteries every year. - Eye Doctor - have an eye exam every 1-2 years - Safe sex - if you may be exposed to STDs, use a condom. - Alcohol If you drink, do it moderately,less than 2 drinks per dayt. - Health Care Power of Attorney.  Choose someone to speak for you if you are not able. - Depression is common in our stressful world.If you're feeling down or losing interest in things you normally enjoy, please come in for a visit.  Follow up in 1 month for the diarrhea if it is not better on the metamucil.

## 2012-11-27 NOTE — Assessment & Plan Note (Signed)
Doing well. Hand out prevention given. Flu today. Will check CMP and Lipids today. Follow up in 1 year.

## 2012-11-28 ENCOUNTER — Encounter: Payer: Self-pay | Admitting: Emergency Medicine

## 2012-12-25 ENCOUNTER — Ambulatory Visit: Payer: BC Managed Care – PPO | Admitting: Emergency Medicine

## 2012-12-30 ENCOUNTER — Ambulatory Visit: Payer: BC Managed Care – PPO | Admitting: Emergency Medicine

## 2013-01-05 ENCOUNTER — Ambulatory Visit: Payer: BC Managed Care – PPO | Admitting: Emergency Medicine

## 2013-01-07 ENCOUNTER — Ambulatory Visit: Payer: BC Managed Care – PPO | Admitting: Emergency Medicine

## 2013-01-07 ENCOUNTER — Ambulatory Visit (INDEPENDENT_AMBULATORY_CARE_PROVIDER_SITE_OTHER): Payer: BC Managed Care – PPO | Admitting: Emergency Medicine

## 2013-01-07 ENCOUNTER — Encounter: Payer: Self-pay | Admitting: Emergency Medicine

## 2013-01-07 VITALS — BP 123/86 | HR 76 | Temp 98.8°F | Ht 64.0 in | Wt 130.0 lb

## 2013-01-07 DIAGNOSIS — R197 Diarrhea, unspecified: Secondary | ICD-10-CM

## 2013-01-07 MED ORDER — LOPERAMIDE HCL 2 MG PO CAPS: 2.0000 mg | ORAL_CAPSULE | ORAL | Status: DC | PRN

## 2013-01-07 NOTE — Patient Instructions (Signed)
It was nice to see you!  Your diarrhea is related to your gut moving too quickly. Keep taking the fiber supplements.  I sent in a medicine call imodium.  Take it as needed for diarrhea.  You may need it just once a day, or you may need it 2 or 3 times a day.  Follow up in 1 year for annual exam or sooner as needed.

## 2013-01-07 NOTE — Assessment & Plan Note (Signed)
History and exam benign. No improvement with increased fiber. Will start Imodium prn. Follow up as needed.

## 2013-01-07 NOTE — Progress Notes (Signed)
  Subjective:    Patient ID: Logan Black, male    DOB: 1970/10/03, 42 y.o.   MRN: [id number]  HPI Logan Black is here for f/u diarrhea.  He reports that there has been no improvement with adding fiber to his diet.  He still reports needed to use the restroom 1-2 hours after eating and having loose to watery stools.  No blood in stool or tenesmus.  No nausea, vomiting, heartburn, abdominal pain, or weight loss.  No family history of GI issues.   I have reviewed and updated the following as appropriate: allergies and current medications SHx: non smoker  Review of Systems See HPI    Objective:   Physical Exam BP 123/86  Pulse 76  Temp(Src) 98.8 F (37.1 C) (Oral)  Ht 5\' 4"  (1.626 m)  Wt 130 lb (58.968 kg)  BMI 22.30 kg/m2 Gen: alert, cooperative, NAD Abd: +BS, soft, NTND Rectal: no external hemorrhoids; normal rectal tone  FOBT: negative     Assessment & Plan:

## 2013-03-18 ENCOUNTER — Telehealth: Payer: Self-pay | Admitting: Emergency Medicine

## 2013-03-18 DIAGNOSIS — R197 Diarrhea, unspecified: Secondary | ICD-10-CM

## 2013-03-18 MED ORDER — LOPERAMIDE HCL 2 MG PO CAPS: 2.0000 mg | ORAL_CAPSULE | ORAL | Status: DC | PRN
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
## 2013-03-18 NOTE — Telephone Encounter (Signed)
[facility] prescription [facility].

## 2013-03-18 NOTE — Telephone Encounter (Signed)
Will fwd to MD.  Crestina Strike L, CMA  

## 2013-03-19 NOTE — Telephone Encounter (Signed)
Pt notified.  Dallan Schonberg L, CMA  

## 2013-11-24 ENCOUNTER — Ambulatory Visit (INDEPENDENT_AMBULATORY_CARE_PROVIDER_SITE_OTHER): Payer: BC Managed Care – PPO | Admitting: Family Medicine

## 2013-11-24 ENCOUNTER — Encounter: Payer: Self-pay | Admitting: Family Medicine

## 2013-11-24 VITALS — BP 118/82 | HR 97 | Ht 64.0 in | Wt 133.0 lb

## 2013-11-24 DIAGNOSIS — Z0000 Encounter for general adult medical examination without abnormal findings: Secondary | ICD-10-CM

## 2013-11-24 NOTE — Patient Instructions (Addendum)
It was great to meet you today!  We are getting some lab work today.  Someone will call you or send you a letter with the results soon.  Shirlee LatchAngela Eugina Row, MD (Dr. Dorothea OgleB) [facility]  Things to do to keep yourself healthy  - Exercise at least 30-45 minutes a day, 3-4 days a week.  - Eat a low-fat diet with lots of fruits and vegetables, up to 7-9 servings per day.  - Seatbelts can save your life. Wear them always.  - Smoke detectors on every level of your home, check batteries every year.  - Eye Doctor - have an eye exam every 1-2 years  - Safe sex - if you may be exposed to STDs, use a condom.  - Alcohol -  If you drink, do it moderately, less than 2 drinks per day.  - Health Care Power of Attorney. Choose someone to speak for you if you are not able.  - Depression is common in our stressful world.If you're feeling down or losing interest in things you normally enjoy, please come in for a visit.  - Violence - If anyone is threatening or hurting you, please call immediately.

## 2013-11-24 NOTE — Assessment & Plan Note (Signed)
Doing well.   UTD on flu and TDAP.  Will check Lipid panel after 10/2 (1 year from last check).  Hand out on prevention/staying healthy given.   Encouraged more exercise. F/u in 1 yr

## 2013-11-24 NOTE — Progress Notes (Signed)
   Subjective:   Logan Black is a 43 y.o. 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  I have reviewed and updated the following as appropriate: allergies, current medications, past family history, past medical history, past social history, past surgical history and problem list  Exercising (walking) 4 days/ wk for ~10 minutes. Never smoker  PHQ2 negative Do you feel safe in relationships? yes   Review of Symptoms  General: Negative for nexplained weight loss, fever  Skin: Negative for new or changing mole, sore that won't heal  HEENT: Negative for trouble hearing, trouble seeing, ringing in ears, mouth sores, hoarseness, change in voice, dysphagia.  CV: Negative for chest pain, dyspnea, edema, palpitations  Resp: Negative for cough, dyspnea, hemoptysis  GI: Negative for N/V/D, constipation, abdominal pain, melena, hematochezia.  GU: Negative for dysuria, incontinence, urinary hesitance, hematuria, vaginal or penile discharge, polyuria, sexual difficulty, lumps in testicle or breasts  MSK: Negative for muscle cramps or aches, joint pain or swelling  Neuro: Negative for headaches, weakness, numbness, dizziness, passing out/fainting  Psych: Negative for depression, anxiety, memory problems    Objective:  BP 118/82  Pulse 97  Ht 5\' 4"  (1.626 m)  Wt 133 lb (60.328 kg)  BMI 22.82 kg/m2  Gen:  43 y.o. male sitting in NAD HEENT: NCAT, MMM, EOMI, PERRL, anicteric sclerae, OP clear, TMs clear b/l CV: RRR, no MRG, no JVD, 2+ DP pulses b/l Resp: Non-labored, CTAB, no wheezes noted Abd: Soft, NTND, BS present, no guarding or organomegaly Ext: WWP, no edema, cyanosis or clubbing MSK: Full ROM, strength 5/5 in UEs/LEs Neuro: Alert and oriented, speech normal, gait normal   Assessment:     Logan Black is a 43 y.o. male here for annual physical exam    Plan:     See problem list for problem-specific plans.   Shirlee LatchAngela Bacigalupo, MD PGY-1,  [facility] 11/24/2013  2:06 PM

## 2013-11-25 ENCOUNTER — Encounter (HOSPITAL_COMMUNITY): Payer: Self-pay | Admitting: Emergency Medicine

## 2013-11-25 ENCOUNTER — Emergency Department (HOSPITAL_COMMUNITY)
Admission: EM | Admit: 2013-11-25 | Discharge: 2013-11-26 | Disposition: A | Discharge status: Home / Self Care | Payer: BC Managed Care – PPO | Attending: Emergency Medicine | Admitting: Emergency Medicine

## 2013-11-25 DIAGNOSIS — R109 Unspecified abdominal pain: Secondary | ICD-10-CM | POA: Diagnosis present

## 2013-11-25 DIAGNOSIS — N201 Calculus of ureter: Secondary | ICD-10-CM | POA: Diagnosis not present

## 2013-11-25 DIAGNOSIS — Z8719 Personal history of other diseases of the digestive system: Secondary | ICD-10-CM | POA: Insufficient documentation

## 2013-11-25 DIAGNOSIS — R1010 Upper abdominal pain, unspecified: Secondary | ICD-10-CM | POA: Insufficient documentation

## 2013-11-25 MED ORDER — FENTANYL CITRATE 0.05 MG/ML IJ SOLN
50.0000 ug | Freq: Once | INTRAMUSCULAR | Status: AC
  Administered 2013-11-26: 50 ug via INTRAVENOUS
  Filled 2013-11-25: qty 2

## 2013-11-25 NOTE — ED Notes (Signed)
Pt complains of right sided pain since 6pm, no vomiting or diarrhea, pt states he wants to go to the bathroom but cant.

## 2013-11-25 NOTE — ED Provider Notes (Addendum)
CSN: [id number]     Arrival date & time 11/25/13  2259 History   First MD Initiated Contact with Patient 11/25/13 2309     Chief Complaint  Patient presents with  . Flank Pain     (Consider location/radiation/quality/duration/timing/severity/associated sxs/prior Treatment) HPI Comments: Pt comes in with cc of right sided pain. Pt has no medical or surgical hx. Pt has no hx of similar pain. Pain started suddenly at 6 pm. Pt's pain uis severe. His pain is worse on the right lateral side. There is no radiation. There is no nausea, emesis, uti like sx - although, pt states that he has had urge to go urinate, with no outcome. Pt has had no renal stones.  Patient is a 43 y.o. male presenting with flank pain. The history is provided by the patient.  Flank Pain Pertinent negatives include no chest pain, no abdominal pain and no shortness of breath.    Past Medical History  Diagnosis Date  . History of gastroesophageal reflux (GERD)    History reviewed. No pertinent past surgical history. Family History  Problem Relation Age of Onset  . Liver cancer Father    History  Substance Use Topics  . Smoking status: Never Smoker   . Smokeless tobacco: Never Used  . Alcohol Use: 2.4 oz/week    4 Cans of beer per week     Comment: 1-3 drinks/week    Review of Systems  Constitutional: Negative for activity change and appetite change.  Respiratory: Negative for cough and shortness of breath.   Cardiovascular: Negative for chest pain.  Gastrointestinal: Negative for abdominal pain.  Genitourinary: Positive for flank pain. Negative for dysuria.      Allergies  Review of patient's allergies indicates no known allergies.  Home Medications   Prior to Admission medications   Medication Sig Start Date End Date Taking? Authorizing Provider  naproxen sodium (ANAPROX) 220 MG tablet Take 220 mg by mouth 2 (two) times daily as needed (pain).   Yes Historical Provider, MD   BP 127/93  Pulse 77   Temp(Src) 97.8 F (36.6 C) (Oral)  Resp 20  SpO2 100% Physical Exam  Constitutional: He is oriented to person, place, and time. He appears well-developed.  HENT:  Head: Normocephalic and atraumatic.  Eyes: Conjunctivae and EOM are normal. Pupils are equal, round, and reactive to light.  Neck: Normal range of motion. Neck supple.  Cardiovascular: Normal rate and regular rhythm.   Pulmonary/Chest: Effort normal and breath sounds normal.  Abdominal: Soft. Bowel sounds are normal. He exhibits no distension. There is tenderness. There is guarding. There is no rebound.  - Murphy;s, - mcnurneys. Pt has tenderness over the lateral, right region, and tenderness really not localized to flank region  Neurological: He is alert and oriented to person, place, and time.  Skin: Skin is warm.    ED Course  Procedures (including critical care time) Labs Review Labs Reviewed  CBC WITH DIFFERENTIAL - Abnormal; Notable for the following:    Neutrophils Relative % 80 (*)    All other components within normal limits  COMPREHENSIVE METABOLIC PANEL - Abnormal; Notable for the following:    Glucose, Bld 127 (*)    GFR calc non Af Amer 75 (*)    GFR calc Af Amer 87 (*)    Anion gap 17 (*)    All other components within normal limits  URINALYSIS, ROUTINE W REFLEX MICROSCOPIC    Imaging Review Ct Abdomen Pelvis Wo Contrast  11/26/2013  CLINICAL DATA:  Right flank pain. Feels the need to use the bathroom but cannot. Labs are pending.  EXAM: CT ABDOMEN AND PELVIS WITHOUT CONTRAST  TECHNIQUE: Multidetector CT imaging of the abdomen and pelvis was performed following the standard protocol without IV contrast.  COMPARISON:  None.  FINDINGS: Atelectasis or infiltration in the lung bases. Calcified granulomas in the lung bases.  There is a 3 mm stone in the distal right ureter at the ureterovesical junction. There is proximal hydronephrosis and hydroureter. There is mild perirenal stranding. Left kidney and ureter  are normal. Bladder is decompressed. No additional stones are demonstrated.  The unenhanced appearance of the liver, spleen, gallbladder, pancreas, adrenal glands, abdominal aorta, inferior vena cava, and retroperitoneal lymph nodes is unremarkable. Stomach and small bowel are decompressed. Scattered stool in the colon without distention. No free air or free fluid in the abdomen.  Pelvis: Prostate gland measures 3.3 x 3.5 cm. No free or loculated pelvic fluid collections. No pelvic mass or lymphadenopathy. Appendix is normal. No inflammatory changes suggested in the sigmoid colon. Mild degenerative changes in the lumbar spine. No destructive bone lesions appreciated.  IMPRESSION: 3 mm stone in the distal right ureter with moderate proximal obstruction.   Electronically Signed   By: Burman NievesWilliam  Stevens M.D.   On: 11/26/2013 00:30     EKG Interpretation None      MDM   Final diagnoses:  Ureterolithiasis    Pt comes in with sudden onset, right sided pain. DDX: Appendicitis Renal stones Pyelonephritis Diverticulitis  CT scan shows renal stones with mild hydronephrosis. PT given the dx. Pain is better post dilaudid, but is slowly coming back. Will give Toradol, as the UA is pending. Pt and wife speak [place]. Spent time discussing return precautions and answered all questions to satisfaction.     Derwood KaplanAnkit Leshae Mcclay, MD 11/26/13 0219  3:33 AM Pain controlled. Urine sent for culture, as there are many bacteria seen. He has no uti like sx, so we will not start any antibiotics for now.  Derwood KaplanAnkit Chancy Claros, MD 11/26/13 [phone]

## 2013-11-26 ENCOUNTER — Emergency Department (HOSPITAL_COMMUNITY): Payer: BC Managed Care – PPO

## 2013-11-26 DIAGNOSIS — R1010 Upper abdominal pain, unspecified: Secondary | ICD-10-CM | POA: Diagnosis present

## 2013-11-26 DIAGNOSIS — Z8719 Personal history of other diseases of the digestive system: Secondary | ICD-10-CM | POA: Diagnosis not present

## 2013-11-26 DIAGNOSIS — N201 Calculus of ureter: Secondary | ICD-10-CM | POA: Diagnosis not present

## 2013-11-26 LAB — COMPREHENSIVE METABOLIC PANEL
ALT: 31 U/L (ref 0–53)
ANION GAP: 17 — AB (ref 5–15)
AST: 26 U/L (ref 0–37)
Albumin: 4.2 g/dL (ref 3.5–5.2)
Alkaline Phosphatase: 55 U/L (ref 39–117)
BUN: 12 mg/dL (ref 6–23)
CALCIUM: 8.7 mg/dL (ref 8.4–10.5)
CO2: 20 mEq/L (ref 19–32)
Chloride: 103 mEq/L (ref 96–112)
Creatinine, Ser: 1.17 mg/dL (ref 0.50–1.35)
GFR calc non Af Amer: 75 mL/min — ABNORMAL LOW (ref >90)
GFR, EST AFRICAN AMERICAN: 87 mL/min — AB (ref >90)
GLUCOSE: 127 mg/dL — AB (ref 70–99)
Potassium: 3.8 mEq/L (ref 3.7–5.3)
SODIUM: 140 meq/L (ref 137–147)
TOTAL PROTEIN: 7.3 g/dL (ref 6.0–8.3)
Total Bilirubin: 0.3 mg/dL (ref 0.3–1.2)

## 2013-11-26 LAB — CBC WITH DIFFERENTIAL/PLATELET
BASOS PCT: 0 % (ref 0–1)
Basophils Absolute: 0 10*3/uL (ref 0.0–0.1)
EOS ABS: 0 10*3/uL (ref 0.0–0.7)
Eosinophils Relative: 0 % (ref 0–5)
HCT: 39.9 % (ref 39.0–52.0)
HEMOGLOBIN: 13.2 g/dL (ref 13.0–17.0)
Lymphocytes Relative: 17 % (ref 12–46)
Lymphs Abs: 1.3 10*3/uL (ref 0.7–4.0)
MCH: 30.1 pg (ref 26.0–34.0)
MCHC: 33.1 g/dL (ref 30.0–36.0)
MCV: 90.9 fL (ref 78.0–100.0)
MONOS PCT: 3 % (ref 3–12)
Monocytes Absolute: 0.2 10*3/uL (ref 0.1–1.0)
NEUTROS PCT: 80 % — AB (ref 43–77)
Neutro Abs: 6.2 10*3/uL (ref 1.7–7.7)
Platelets: 228 10*3/uL (ref 150–400)
RBC: 4.39 MIL/uL (ref 4.22–5.81)
RDW: 12.1 % (ref 11.5–15.5)
WBC: 7.7 10*3/uL (ref 4.0–10.5)

## 2013-11-26 LAB — URINALYSIS, ROUTINE W REFLEX MICROSCOPIC
Glucose, UA: NEGATIVE mg/dL
Ketones, ur: NEGATIVE mg/dL
Leukocytes, UA: NEGATIVE
Nitrite: NEGATIVE
PROTEIN: 30 mg/dL — AB
Specific Gravity, Urine: 1.035 — ABNORMAL HIGH (ref 1.005–1.030)
UROBILINOGEN UA: 0.2 mg/dL (ref 0.0–1.0)
pH: 5.5 (ref 5.0–8.0)

## 2013-11-26 LAB — URINE MICROSCOPIC-ADD ON

## 2013-11-26 MED ORDER — IBUPROFEN 600 MG PO TABS: 600.0000 mg | ORAL_TABLET | Freq: Four times a day (QID) | ORAL | Status: DC | PRN

## 2013-11-26 MED ORDER — KETOROLAC TROMETHAMINE 30 MG/ML IJ SOLN
30.0000 mg | Freq: Once | INTRAMUSCULAR | Status: AC
  Administered 2013-11-26: 30 mg via INTRAVENOUS
  Filled 2013-11-26: qty 1

## 2013-11-26 MED ORDER — TAMSULOSIN HCL 0.4 MG PO CAPS: 0.4000 mg | ORAL_CAPSULE | Freq: Every day | ORAL | Status: DC

## 2013-11-26 MED ORDER — ONDANSETRON 8 MG PO TBDP: 8.0000 mg | ORAL_TABLET | Freq: Three times a day (TID) | ORAL | Status: DC | PRN

## 2013-11-26 MED ORDER — HYDROCODONE-ACETAMINOPHEN 5-325 MG PO TABS: 1.0000 | ORAL_TABLET | Freq: Four times a day (QID) | ORAL | Status: DC | PRN

## 2013-11-26 NOTE — Discharge Instructions (Signed)
You have kidney stones. Take the pain and nausea medications as prescribed. See the urologist as requested. Read information provided on renal stones. Please return to the ER if your symptoms worsen; you have increased pain, fevers, chills, inability to keep any medications down, confusion. Otherwise see the outpatient doctor as requested.   Ureteral Colic (Kidney Stones) Ureteral colic is the result of a condition when kidney stones form inside the kidney. Once kidney stones are formed they may move into the tube that connects the kidney with the bladder (ureter). If this occurs, this condition may cause pain (colic) in the ureter.  CAUSES  Pain is caused by stone movement in the ureter and the obstruction caused by the stone. SYMPTOMS  The pain comes and goes as the ureter contracts around the stone. The pain is usually intense, sharp, and stabbing in character. The location of the pain may move as the stone moves through the ureter. When the stone is near the kidney the pain is usually located in the back and radiates to the belly (abdomen). When the stone is ready to pass into the bladder the pain is often located in the lower abdomen on the side the stone is located. At this location, the symptoms may mimic those of a urinary tract infection with urinary frequency. Once the stone is located here it often passes into the bladder and the pain disappears completely. TREATMENT   Your caregiver will provide you with medicine for pain relief.  You may require specialized follow-up X-rays.  The absence of pain does not always mean that the stone has passed. It may have just stopped moving. If the urine remains completely obstructed, it can cause loss of kidney function or even complete destruction of the involved kidney. It is your responsibility and in your interest that X-rays and follow-ups as suggested by your caregiver are completed. Relief of pain without passage of the stone can be associated  with severe damage to the kidney, including loss of kidney function on that side.  If your stone does not pass on its own, additional measures may be taken by your caregiver to ensure its removal. HOME CARE INSTRUCTIONS   Increase your fluid intake. Water is the preferred fluid since juices containing vitamin C may acidify the urine making it less likely for certain stones (uric acid stones) to pass.  Strain all urine. A strainer will be provided. Keep all particulate matter or stones for your caregiver to inspect.  Take your pain medicine as directed.  Make a follow-up appointment with your caregiver as directed.  Remember that the goal is passage of your stone. The absence of pain does not mean the stone is gone. Follow your caregiver's instructions.  Only take over-the-counter or prescription medicines for pain, discomfort, or fever as directed by your caregiver. SEEK MEDICAL CARE IF:   Pain cannot be controlled with the prescribed medicine.  You have a fever.  Pain continues for longer than your caregiver advises it should.  There is a change in the pain, and you develop chest discomfort or constant abdominal pain.  You feel faint or pass out. MAKE SURE YOU:   Understand these instructions.  Will watch your condition.  Will get help right away if you are not doing well or get worse. Document Released: 11/22/2004 Document Revised: 06/09/2012 Document Reviewed: 08/09/2010 [facility] Patient Information 2015 [place], [place]. This information is not intended to replace advice given to you by your health care provider. Make sure you discuss  any questions you have with your health care provider.  Kidney Stones Kidney stones (urolithiasis) are deposits that form inside your kidneys. The intense pain is caused by the stone moving through the urinary tract. When the stone moves, the ureter goes into spasm around the stone. The stone is usually passed in the urine.  CAUSES   A  disorder that makes certain neck glands produce too much parathyroid hormone (primary hyperparathyroidism).  A buildup of uric acid crystals, similar to gout in your joints.  Narrowing (stricture) of the ureter.  A kidney obstruction present at birth (congenital obstruction).  Previous surgery on the kidney or ureters.  Numerous kidney infections. SYMPTOMS   Feeling sick to your stomach (nauseous).  Throwing up (vomiting).  Blood in the urine (hematuria).  Pain that usually spreads (radiates) to the groin.  Frequency or urgency of urination. DIAGNOSIS   Taking a history and physical exam.  Blood or urine tests.  CT scan.  Occasionally, an examination of the inside of the urinary bladder (cystoscopy) is performed. TREATMENT   Observation.  Increasing your fluid intake.  Extracorporeal shock wave lithotripsy--This is a noninvasive procedure that uses shock waves to break up kidney stones.  Surgery may be needed if you have severe pain or persistent obstruction. There are various surgical procedures. Most of the procedures are performed with the use of small instruments. Only small incisions are needed to accommodate these instruments, so recovery time is minimized. The size, location, and chemical composition are all important variables that will determine the proper choice of action for you. Talk to your health care provider to better understand your situation so that you will minimize the risk of injury to yourself and your kidney.  HOME CARE INSTRUCTIONS   Drink enough water and fluids to keep your urine clear or pale yellow. This will help you to pass the stone or stone fragments.  Strain all urine through the provided strainer. Keep all particulate matter and stones for your health care provider to see. The stone causing the pain may be as small as a grain of salt. It is very important to use the strainer each and every time you pass your urine. The collection of your  stone will allow your health care provider to analyze it and verify that a stone has actually passed. The stone analysis will often identify what you can do to reduce the incidence of recurrences.  Only take over-the-counter or prescription medicines for pain, discomfort, or fever as directed by your health care provider.  Make a follow-up appointment with your health care provider as directed.  Get follow-up X-rays if required. The absence of pain does not always mean that the stone has passed. It may have only stopped moving. If the urine remains completely obstructed, it can cause loss of kidney function or even complete destruction of the kidney. It is your responsibility to make sure X-rays and follow-ups are completed. Ultrasounds of the kidney can show blockages and the status of the kidney. Ultrasounds are not associated with any radiation and can be performed easily in a matter of minutes. SEEK MEDICAL CARE IF:  You experience pain that is progressive and unresponsive to any pain medicine you have been prescribed. SEEK IMMEDIATE MEDICAL CARE IF:   Pain cannot be controlled with the prescribed medicine.  You have a fever or shaking chills.  The severity or intensity of pain increases over 18 hours and is not relieved by pain medicine.  You develop a new  onset of abdominal pain.  You feel faint or pass out.  You are unable to urinate. MAKE SURE YOU:   Understand these instructions.  Will watch your condition.  Will get help right away if you are not doing well or get worse. Document Released: 02/12/2005 Document Revised: 10/15/2012 Document Reviewed: 07/16/2012 [facility] Patient Information 2015 [place]. This information is not intended to replace advice given to you by your health care provider. Make sure you discuss any questions you have with your health care provider.

## 2013-11-27 ENCOUNTER — Telehealth: Payer: Self-pay | Admitting: Family Medicine

## 2013-11-27 DIAGNOSIS — Z0000 Encounter for general adult medical examination without abnormal findings: Secondary | ICD-10-CM

## 2013-11-27 LAB — URINE CULTURE
Colony Count: 4000
SPECIAL REQUESTS: NORMAL

## 2013-11-27 NOTE — Telephone Encounter (Signed)
Spoke with wife, scheduled lab appointment for 10/5

## 2013-11-27 NOTE — Telephone Encounter (Signed)
No future orders in chart, patient likely needs appt. Will forward to MD to advise.

## 2013-11-27 NOTE — Telephone Encounter (Signed)
Pt was seen earlier in the week at the ER. The ER did some lab work and the patient is also schedule for lab's on 10/5. The patient is wanting to know do they still need to have lab on Monday. Please look at the chart and let patient know if the need to come to the appointment. jw

## 2013-11-27 NOTE — Telephone Encounter (Signed)
I'm not sure what happened to my other future order, but I did want the patient to have a lipid panel Monday (needed to be after 11/27/13 for insurance to cover annual testing).  This was [place] on [id number].  I ordered it again.  Please advise patient that he should still come in for a lab appt.  Thanks! Shirlee LatchAngela Anquanette Bahner, MD, MPH PGY-1,  [facility] 11/27/2013  2:51 PM

## 2013-11-30 ENCOUNTER — Other Ambulatory Visit: Payer: BC Managed Care – PPO

## 2013-11-30 ENCOUNTER — Encounter: Payer: Self-pay | Admitting: Family Medicine

## 2013-11-30 DIAGNOSIS — Z0000 Encounter for general adult medical examination without abnormal findings: Secondary | ICD-10-CM

## 2013-11-30 LAB — LIPID PANEL
Cholesterol: 203 mg/dL — ABNORMAL HIGH (ref 0–200)
HDL: 49 mg/dL (ref >39)
LDL Cholesterol: 122 mg/dL — ABNORMAL HIGH (ref 0–99)
TRIGLYCERIDES: 160 mg/dL — AB (ref <150)
Total CHOL/HDL Ratio: 4.1 Ratio
VLDL: 32 mg/dL (ref 0–40)

## 2013-11-30 NOTE — Progress Notes (Signed)
FLP DONE TODAY Verba Ainley 

## 2016-03-29 IMAGING — CT CT ABD-PELV W/O CM
1 series · 14 of 18 positions shown, 19 images · non-contrast
Comparison: None.

CLINICAL DATA: Right flank pain. Feels the need to use the bathroom
but cannot. Labs are pending.

EXAM:
CT ABDOMEN AND PELVIS WITHOUT CONTRAST
TECHNIQUE: Multidetector CT imaging of the abdomen and pelvis was performed
following the standard protocol without IV contrast.

[Series 3: lung · axial · 0.61mm/px · z∈[+1353,+1428]mm · 14 of 18 slices shown, 19 images]
[im 2/18  soft-tissue]
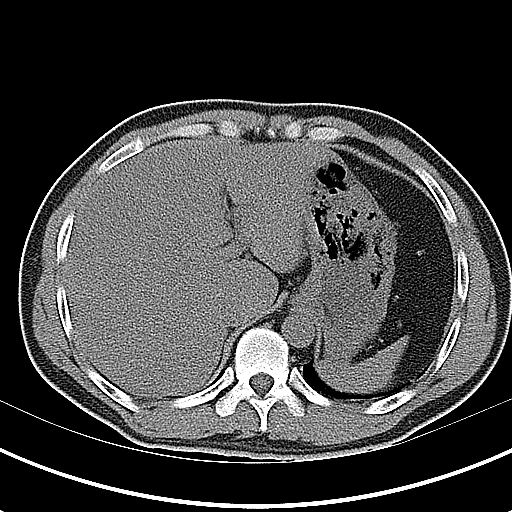
[im 2/18  bone]
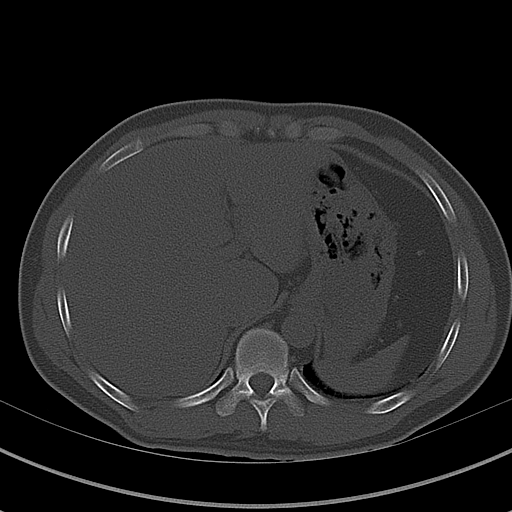
[im 3/18  soft-tissue]
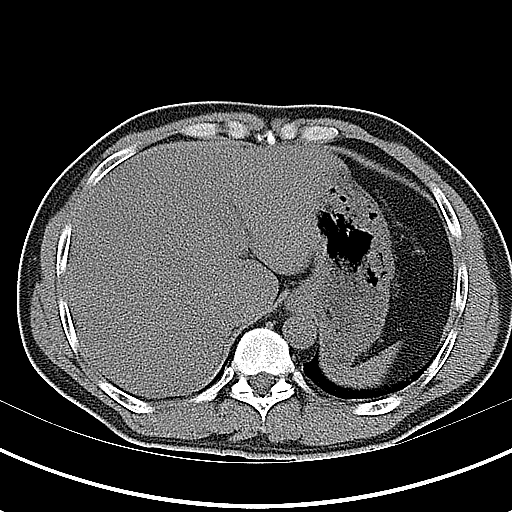
[im 5/18  soft-tissue]
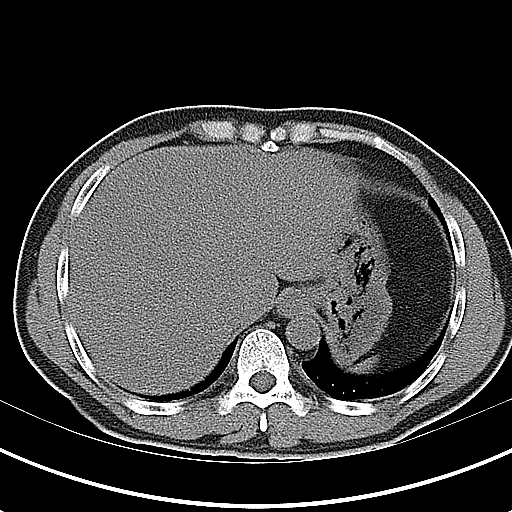
[im 6/18  soft-tissue]
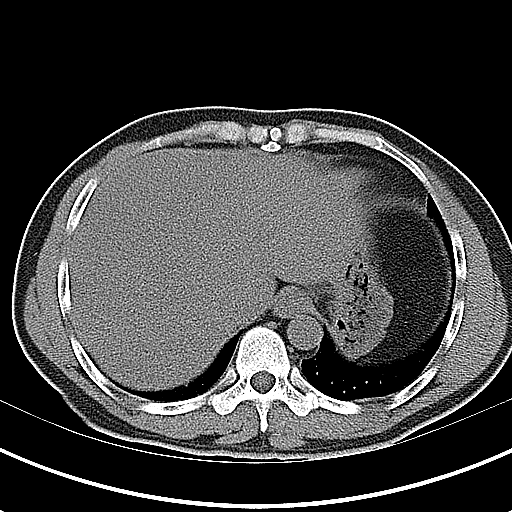
[im 7/18  soft-tissue]
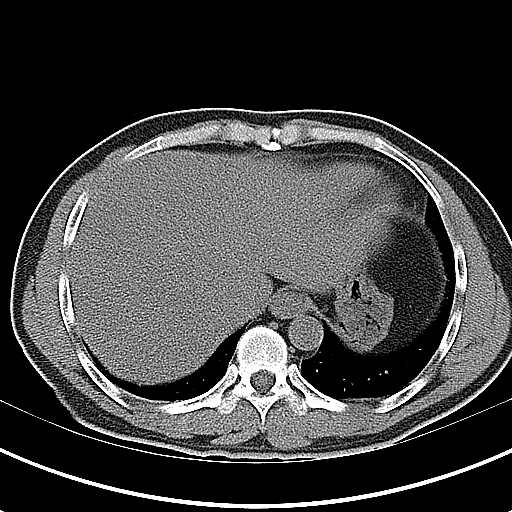
[im 8/18  soft-tissue]
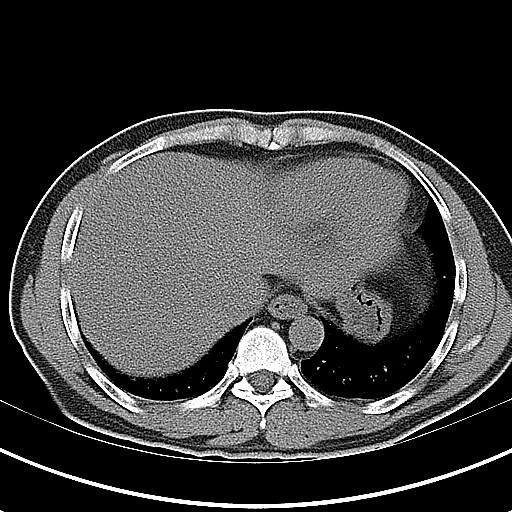
[im 10/18  soft-tissue]
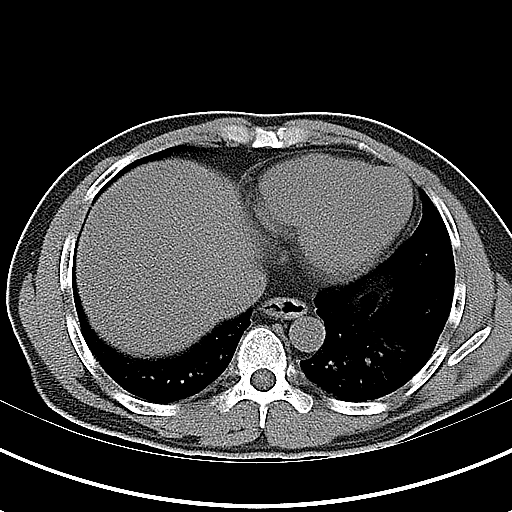
[im 11/18  soft-tissue]
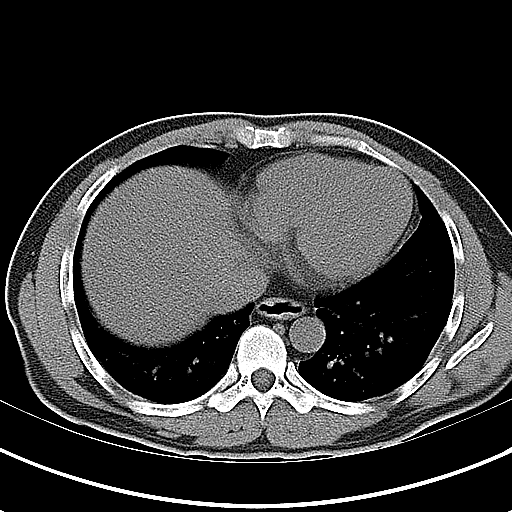
[im 12/18  soft-tissue]
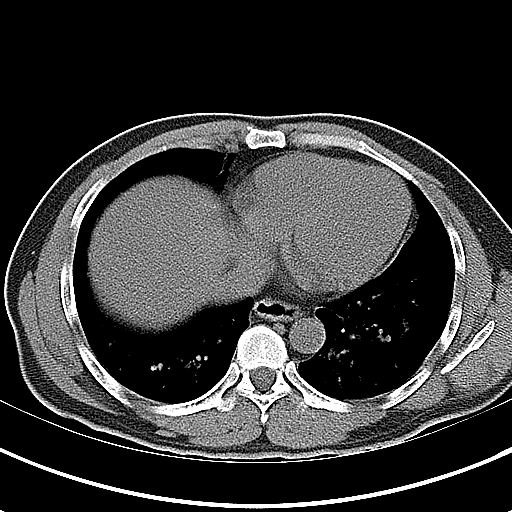
[im 12/18  bone]
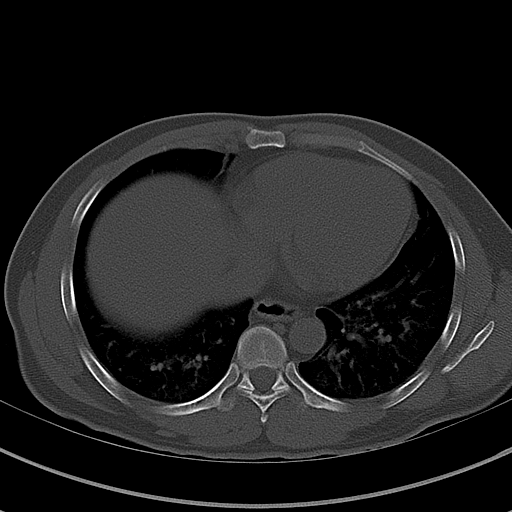
[im 13/18  soft-tissue]
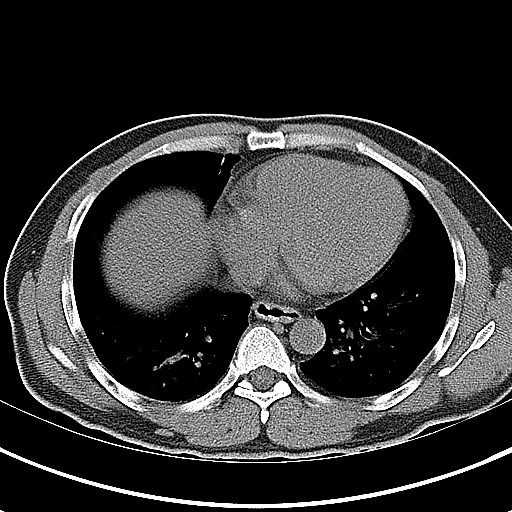
[im 14/18  soft-tissue]
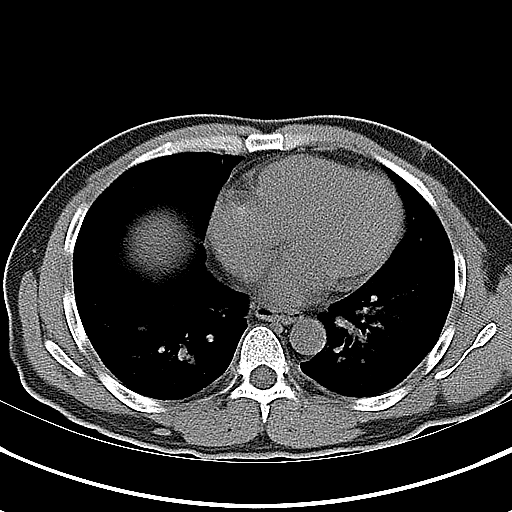
[im 14/18  lung]
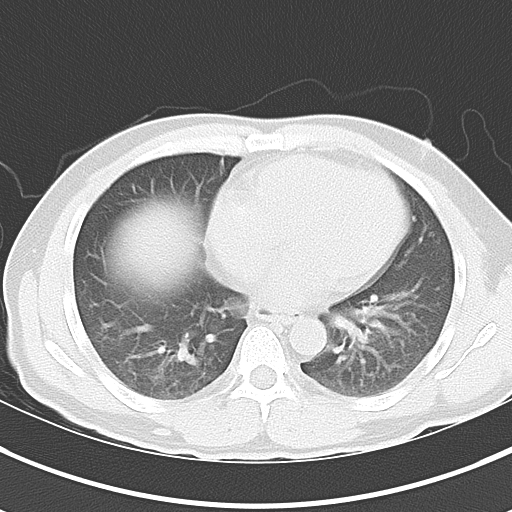
[im 15/18  lung]
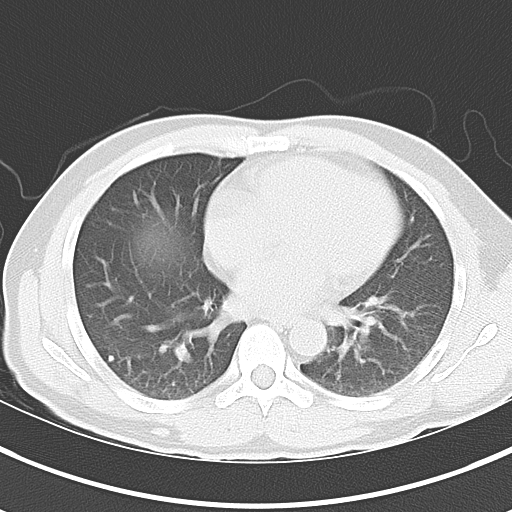
[im 16/18  soft-tissue]
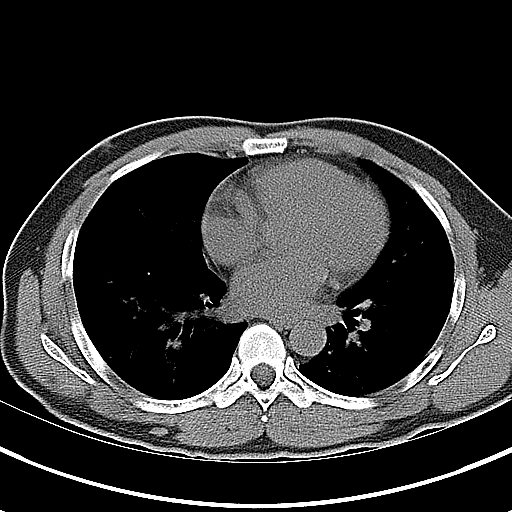
[im 16/18  lung]
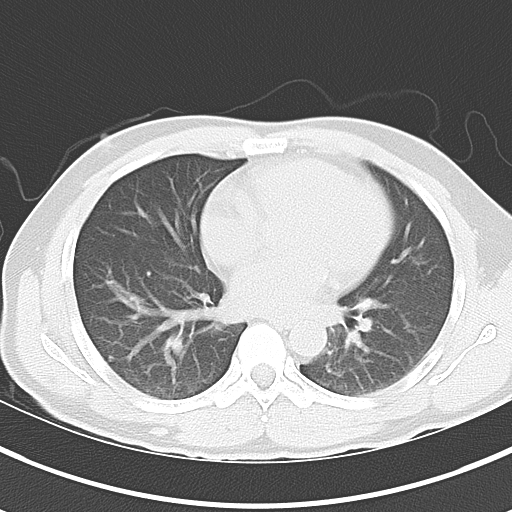
[im 17/18  soft-tissue]
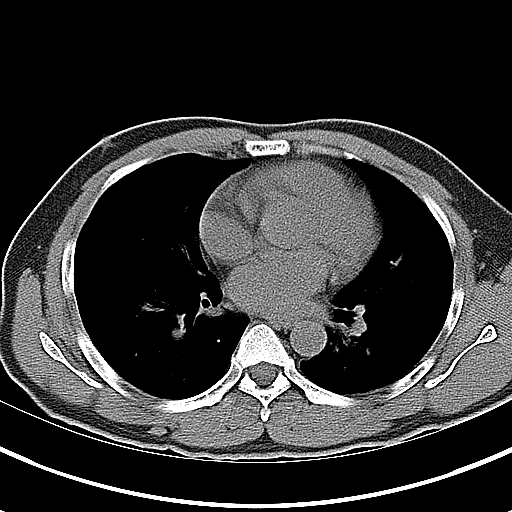
[im 17/18  lung]
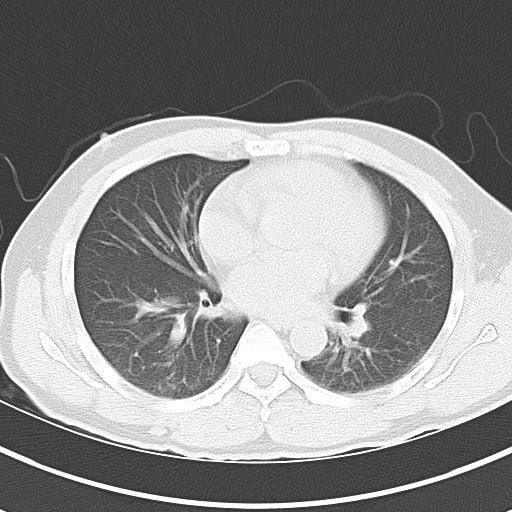

[14 of 18 positions shown; findings below may reference images not displayed]

FINDINGS: Atelectasis or infiltration in the lung bases. Calcified granulomas
in the lung bases.

There is a 3 mm stone in the distal right ureter at the
ureterovesical junction. There is proximal hydronephrosis and
hydroureter. There is mild perirenal stranding. Left kidney and
ureter are normal. Bladder is decompressed. No additional stones are
demonstrated.

The unenhanced appearance of the liver, spleen, gallbladder,
pancreas, adrenal glands, abdominal aorta, inferior vena cava, and
retroperitoneal lymph nodes is unremarkable. Stomach and small bowel
are decompressed. Scattered stool in the colon without distention.
No free air or free fluid in the abdomen.

Pelvis: Prostate gland measures 3.3 x 3.5 cm. No free or loculated
pelvic fluid collections. No pelvic mass or lymphadenopathy.
Appendix is normal. No inflammatory changes suggested in the sigmoid
colon. Mild degenerative changes in the lumbar spine. No destructive
bone lesions appreciated.
IMPRESSION: 3 mm stone in the distal right ureter with moderate proximal
obstruction.

## 2016-07-05 ENCOUNTER — Ambulatory Visit (INDEPENDENT_AMBULATORY_CARE_PROVIDER_SITE_OTHER): Payer: BLUE CROSS/BLUE SHIELD | Admitting: Family Medicine

## 2016-07-05 ENCOUNTER — Encounter: Payer: Self-pay | Admitting: Family Medicine

## 2016-07-05 VITALS — BP 118/88 | HR 89 | Temp 98.8°F | Ht 64.0 in | Wt 131.0 lb

## 2016-07-05 DIAGNOSIS — M67431 Ganglion, right wrist: Secondary | ICD-10-CM | POA: Insufficient documentation

## 2016-07-05 DIAGNOSIS — Z0000 Encounter for general adult medical examination without abnormal findings: Secondary | ICD-10-CM | POA: Diagnosis not present

## 2016-07-05 NOTE — Assessment & Plan Note (Signed)
Incidentally noted Not bothersome to the patient Will continue to monitor Could consider drainage and injection of steroid in the future

## 2016-07-05 NOTE — Assessment & Plan Note (Signed)
Doing well UTD on flu and TDAP Will check lipid panel, CMP, HIV screening Given info on prevention/staying healthy given Encouraged more exercise f/u in 1 yr

## 2016-07-05 NOTE — Patient Instructions (Signed)

## 2016-07-05 NOTE — Progress Notes (Signed)
   Subjective:   46 y.o. year old male presents for preventative visit and annual physical.   Acute Concerns: none  Diet: rice, meat, fish vegetables  Exercise: not much - on feet a lot  Sexual History: currently sexually active with 1 male partner (wife)  POA/Living Will: none  Social:  Social History   Social History  . Marital status: Married    Spouse name: N/A  . Number of children: N/A  . Years of education: N/A   Social History Main Topics  . Smoking status: Never Smoker  . Smokeless tobacco: Never Used  . Alcohol use 2.4 oz/week    4 Cans of beer per week     Comment: 1-3 drinks/week  . Drug use: No  . Sexual activity: Not Asked   Other Topics Concern  . None   Social History Narrative   Works as Agricultural engineermanicurist.     Immunization: Immunization History  Administered Date(s) Administered  . Influenza Split 01/02/2011, 01/02/2012  . Influenza Whole 11/28/2009  . Influenza-Unspecified 10/28/2013  . Tdap 01/02/2012    Cancer Screening:  Colonoscopy: n/a   Prostate:  n/a  Review of Systems: Per HPI.   Objective:  BP 118/88   Pulse 89   Temp 98.8 F (37.1 C) (Oral)   Ht 5\' 4"  (1.626 m)   Wt 131 lb (59.4 kg)   SpO2 99%   BMI 22.49 kg/m   Gen:  46 y.o. male in NAD  HEENT: NCAT, MMM, EOMI, PERRL, anicteric sclerae, TMs clear b/l CV: RRR, no MRG Resp: Non-labored, CTAB, no wheezes noted Abd: Soft, NTND, BS present, no guarding or organomegaly  Ext: WWP, no edema Skin: No rashes   MSK: ganglion cyst noted over dorsum of R wrist Neuro: Alert and oriented, speech normal         Chemistry      Component Value Date/Time   NA 140 11/26/2013 0004   K 3.8 11/26/2013 0004   CL 103 11/26/2013 0004   CO2 20 11/26/2013 0004   BUN 12 11/26/2013 0004   CREATININE 1.17 11/26/2013 0004   CREATININE 1.00 11/27/2012 0952      Component Value Date/Time   CALCIUM 8.7 11/26/2013 0004   ALKPHOS 55 11/26/2013 0004   AST 26 11/26/2013 0004   ALT 31  11/26/2013 0004   BILITOT 0.3 11/26/2013 0004      Lab Results  Component Value Date   WBC 7.7 11/26/2013   HGB 13.2 11/26/2013   HCT 39.9 11/26/2013   MCV 90.9 11/26/2013   PLT 228 11/26/2013   No results found for: TSH No results found for: HGBA1C    ASSESSMENT & PLAN: [age] y.o. male presents for annual preventative exam. Please see problem specific assessment and plan.   Annual physical exam Doing well UTD on flu and TDAP Will check lipid panel, CMP, HIV screening Given info on prevention/staying healthy given Encouraged more exercise f/u in 1 yr  Ganglion cyst of dorsum of right wrist Incidentally noted Not bothersome to the patient Will continue to monitor Could consider drainage and injection of steroid in the future   Judas Mohammad, Marzella SchleinAngela M, MD, MPH PGY-3,  [facility] 07/05/2016 9:54 AM

## 2016-07-06 ENCOUNTER — Encounter: Payer: Self-pay | Admitting: Family Medicine

## 2016-07-06 LAB — CMP14+EGFR
ALBUMIN: 4.3 g/dL (ref 3.5–5.5)
ALK PHOS: 62 IU/L (ref 39–117)
ALT: 17 IU/L (ref 0–44)
AST: 14 IU/L (ref 0–40)
Albumin/Globulin Ratio: 1.7 (ref 1.2–2.2)
BUN / CREAT RATIO: 13 (ref 9–20)
BUN: 12 mg/dL (ref 6–24)
Bilirubin Total: 0.4 mg/dL (ref 0.0–1.2)
CO2: 21 mmol/L (ref 18–29)
CREATININE: 0.93 mg/dL (ref 0.76–1.27)
Calcium: 8.6 mg/dL — ABNORMAL LOW (ref 8.7–10.2)
Chloride: 105 mmol/L (ref 96–106)
GFR calc Af Amer: 114 mL/min/{1.73_m2} (ref >59)
GFR calc non Af Amer: 99 mL/min/{1.73_m2} (ref >59)
GLOBULIN, TOTAL: 2.5 g/dL (ref 1.5–4.5)
Glucose: 92 mg/dL (ref 65–99)
Potassium: 4.5 mmol/L (ref 3.5–5.2)
SODIUM: 141 mmol/L (ref 134–144)
Total Protein: 6.8 g/dL (ref 6.0–8.5)

## 2016-07-06 LAB — LIPID PANEL
CHOLESTEROL TOTAL: 196 mg/dL (ref 100–199)
Chol/HDL Ratio: 4.2 ratio (ref 0.0–5.0)
HDL: 47 mg/dL (ref >39)
LDL CALC: 123 mg/dL — AB (ref 0–99)
Triglycerides: 130 mg/dL (ref 0–149)
VLDL Cholesterol Cal: 26 mg/dL (ref 5–40)

## 2016-07-06 LAB — HIV ANTIBODY (ROUTINE TESTING W REFLEX): HIV Screen 4th Generation wRfx: NONREACTIVE

## 2017-09-03 ENCOUNTER — Encounter: Payer: BLUE CROSS/BLUE SHIELD | Admitting: Family Medicine

## 2017-09-03 NOTE — Progress Notes (Deleted)
   Subjective:   Patient ID: Logan BlackbirdPhuoc Battaglia    DOB: 09/13/1970, 47 y.o. male   MRN: [id number]  CC: ***  HPI: Logan Blackbirdhuoc Hommel is a 47 y.o. male who presents to clinic today ***. Problems discussed today are as follows:  ROS: See HPI for pertinent ROS.  PMFSH: Pertinent past medical, surgical, family, and social history were reviewed and updated as appropriate. Smoking status reviewed.  Medications reviewed.  Objective:   There were no vitals taken for this visit. Vitals and nursing note reviewed.  General: well nourished, well developed, in no acute distress with non-toxic appearance HEENT: normocephalic, atraumatic, moist mucous membranes Neck: supple, non-tender without lymphadenopathy CV: regular rate and rhythm without murmurs rubs or gallops Lungs: clear to auscultation bilaterally with normal work of breathing Abdomen: soft, non-tender, no masses or organomegaly palpable, normoactive bowel sounds Skin: warm, dry, no rashes or lesions, cap refill < 2 seconds Extremities: warm and well perfused, normal tone  Assessment & Plan:   No problem-specific Assessment & Plan notes found for this encounter.  No orders of the defined types were placed in this encounter.  No orders of the defined types were placed in this encounter.   Freddrick MarchYashika Bobetta Korf, MD [facility], PGY-2 09/03/2017 1:35 PM

## 2018-02-11 ENCOUNTER — Other Ambulatory Visit: Payer: Self-pay

## 2018-02-11 ENCOUNTER — Encounter: Payer: Self-pay | Admitting: Family Medicine

## 2018-02-11 ENCOUNTER — Ambulatory Visit (INDEPENDENT_AMBULATORY_CARE_PROVIDER_SITE_OTHER): Payer: BLUE CROSS/BLUE SHIELD | Admitting: Family Medicine

## 2018-02-11 VITALS — BP 100/70 | HR 95 | Temp 97.6°F | Wt 134.0 lb

## 2018-02-11 DIAGNOSIS — Z131 Encounter for screening for diabetes mellitus: Secondary | ICD-10-CM | POA: Diagnosis not present

## 2018-02-11 DIAGNOSIS — Z23 Encounter for immunization: Secondary | ICD-10-CM | POA: Diagnosis not present

## 2018-02-11 DIAGNOSIS — Z0000 Encounter for general adult medical examination without abnormal findings: Secondary | ICD-10-CM | POA: Diagnosis not present

## 2018-02-11 LAB — POCT GLYCOSYLATED HEMOGLOBIN (HGB A1C): Hemoglobin A1C: 5.5 % (ref 4.0–5.6)

## 2018-02-11 NOTE — Progress Notes (Signed)
   Subjective:   Patient ID: Logan BlackbirdPhuoc Black    DOB: 02/27/1970, 47 y.o. male   MRN: [id number]  CC: physical   HPI: Logan Black is a 47 y.o. male who presents to clinic today for routine physical and health maintenance.  Patient states he is otherwise healthy with no significant medical problems.  Does not take any medications.  Last seen 06/2016.    Health Maintenance:  -due for flu shot today   ROS: No fevers, chills, nausea, vomiting, CP, SOB, palpitations.   Social: pt is a never smoker.  Medications reviewed. Objective:   BP 100/70   Pulse 95   Temp 97.6 F (36.4 C) (Oral)   Wt 134 lb (60.8 kg)   SpO2 95%   BMI 23.00 kg/m  Vitals and nursing note reviewed.  General: 47 year old male, NAD HEENT: NCAT, EOMI, PERRL, MMM, oropharynx clear, no tonsillar erythema or exudate Neck: supple, nontender CV: RRR no MRG  Lungs: CTAB, normal effort  Abdomen: soft, NTND, +bs  Skin: warm, dry, no rash Extremities: warm and well perfused, normal tone Neuro: alert, oriented x3, no focal deficits   Assessment & Plan:   Healthy 47 year old male presents for annual routine physical.  No concerns at today's visit.   Check lipid panel and A1c for screening.  Counseled on diet and exercise.  Health maintenance: -Flu shot given today  Follow-up: 1 year or sooner if needed  Orders Placed This Encounter  Procedures  . Lipid panel  . POCT glycosylated hemoglobin (Hb A1C)   Freddrick MarchYashika Bryston Colocho, MD [facility]

## 2018-02-11 NOTE — Patient Instructions (Signed)
It was nice meeting you today.  You were seen in clinic for routine physical and are doing great.  We did some blood work today to check your cholesterol and to screen for diabetes.  You also were due for a flu shot which you received today.    You may follow-up in 1 year or sooner if needed for your next annual visit.  Please call clinic if you have any questions.  Freddrick MarchYashika Ethen Bannan MD

## 2018-02-12 LAB — LIPID PANEL
CHOLESTEROL TOTAL: 239 mg/dL — AB (ref 100–199)
Chol/HDL Ratio: 5 ratio (ref 0.0–5.0)
HDL: 48 mg/dL (ref >39)
LDL Calculated: 119 mg/dL — ABNORMAL HIGH (ref 0–99)
Triglycerides: 360 mg/dL — ABNORMAL HIGH (ref 0–149)
VLDL Cholesterol Cal: 72 mg/dL — ABNORMAL HIGH (ref 5–40)

## 2018-02-20 ENCOUNTER — Other Ambulatory Visit: Payer: Self-pay | Admitting: Family Medicine

## 2018-02-20 MED ORDER — ATORVASTATIN CALCIUM 40 MG PO TABS: 40.0000 mg | ORAL_TABLET | Freq: Every day | ORAL | 0 refills | Status: DC

## 2018-12-31 ENCOUNTER — Other Ambulatory Visit: Payer: Self-pay

## 2018-12-31 DIAGNOSIS — Z20822 Contact with and (suspected) exposure to covid-19: Secondary | ICD-10-CM

## 2019-01-01 LAB — NOVEL CORONAVIRUS, NAA: SARS-CoV-2, NAA: NOT DETECTED

## 2019-01-02 ENCOUNTER — Telehealth: Payer: Self-pay | Admitting: Family Medicine

## 2019-01-02 NOTE — Telephone Encounter (Signed)
Patient is calling to request negative COVD results. Patient expressed understanding.

## 2019-05-04 NOTE — Progress Notes (Signed)
   Subjective:  CC -- Annual Physical; With complaints of none  Pt reports he is overall doing well    Cardiovascular: - Risk as of 05/05/19(date):  The 10-year ASCVD risk score Denman George DC Jr., et al., 2013) is: 2.9%   Values used to calculate the score:     Age: 49 years     Sex: Male     Is Non-Hispanic African American: No     Diabetic: No     Tobacco smoker: No     Systolic Blood Pressure: 114 mmHg     Is BP treated: No     HDL Cholesterol: 48 mg/dL     Total Cholesterol: 239 mg/dL  - Dx Hypertension: no  - Dx Hyperlipidemia: yes   - Dx Obesity: no   - Physical Activity: sometimes   - Diabetes: no   Cancer: Colorectal >> Colonoscopy: no  Lung >> Tobacco Use: no   - If so, previous Low-Dose CT screen: no  Prostate >> Interested in DRE and/or PSA: no  Skin >> Suspicious lesions: no   Social: Alcohol Use: yes  Tobacco Use: no   - Interested in Quitting: no  Other Drugs: no  Risky Sexual Behavior: no  Depression: no   - PHQ2 score: 0 Support and Life at Home: yes   Other: Osteoporosis: no  Zoster Vaccine: no  Flu Vaccine: yes  Pneumonia Vaccine: no    Past Medical History Patient Active Problem List   Diagnosis Date Noted  . Annual physical exam 11/27/2012  . Seizure disorder (HCC) 11/28/2009    Medications- reviewed and updated Current Outpatient Medications  Medication Sig Dispense Refill  . atorvastatin (LIPITOR) 40 MG tablet Take 1 tablet (40 mg total) by mouth daily. 90 tablet 0   No current facility-administered medications for this visit.    Objective: BP 114/80   Pulse 78   Ht 5\' 4"  (1.626 m)   Wt 133 lb 12.8 oz (60.7 kg)   SpO2 97%   BMI 22.97 kg/m  Gen: NAD, alert, cooperative with exam HEENT: NCAT, EOMI, PERRL CV: RRR, good S1/S2, no murmur Resp: CTABL, no wheezes, non-labored Abd: Soft, Non Tender, Non Distended, BS present, no guarding or organomegaly Genital Exam: not done Ext: No edema, warm Neuro: Alert and oriented, No gross  deficits   Assessment/Plan:  Annual physical exam Flu vaccine given today  Will obtain lipid panel, BMP, CBC today per patient request.  Encouraged healthy diet and daily exercise  F/u in 1 year   Orders Placed This Encounter  Procedures  . Flu Vaccine QUAD 36+ mos IM  . CBC  . Basic Metabolic Panel  . Lipid Panel    No orders of the defined types were placed in this encounter.    , DO, PGY-3 05/05/2019 3:43 PM

## 2019-05-05 ENCOUNTER — Ambulatory Visit (INDEPENDENT_AMBULATORY_CARE_PROVIDER_SITE_OTHER): Payer: 59 | Admitting: Family Medicine

## 2019-05-05 ENCOUNTER — Encounter: Payer: Self-pay | Admitting: Family Medicine

## 2019-05-05 ENCOUNTER — Other Ambulatory Visit: Payer: Self-pay

## 2019-05-05 VITALS — BP 114/80 | HR 78 | Ht 64.0 in | Wt 133.8 lb

## 2019-05-05 DIAGNOSIS — E785 Hyperlipidemia, unspecified: Secondary | ICD-10-CM

## 2019-05-05 DIAGNOSIS — Z23 Encounter for immunization: Secondary | ICD-10-CM

## 2019-05-05 DIAGNOSIS — G40909 Epilepsy, unspecified, not intractable, without status epilepticus: Secondary | ICD-10-CM

## 2019-05-05 DIAGNOSIS — Z0000 Encounter for general adult medical examination without abnormal findings: Secondary | ICD-10-CM

## 2019-05-05 NOTE — Patient Instructions (Signed)
Exercising to Stay Healthy To become healthy and stay healthy, it is recommended that you do moderate-intensity and vigorous-intensity exercise. You can tell that you are exercising at a moderate intensity if your heart starts beating faster and you start breathing faster but can still hold a conversation. You can tell that you are exercising at a vigorous intensity if you are breathing much harder and faster and cannot hold a conversation while exercising. Exercising regularly is important. It has many health benefits, such as:  Improving overall fitness, flexibility, and endurance.  Increasing bone density.  Helping with weight control.  Decreasing body fat.  Increasing muscle strength.  Reducing stress and tension.  Improving overall health. How often should I exercise? Choose an activity that you enjoy, and set realistic goals. Your health care provider can help you make an activity plan that works for you. Exercise regularly as told by your health care provider. This may include:  Doing strength training two times a week, such as: ? Lifting weights. ? Using resistance bands. ? Push-ups. ? Sit-ups. ? Yoga.  Doing a certain intensity of exercise for a given amount of time. Choose from these options: ? A total of 150 minutes of moderate-intensity exercise every week. ? A total of 75 minutes of vigorous-intensity exercise every week. ? A mix of moderate-intensity and vigorous-intensity exercise every week. Children, pregnant women, people who have not exercised regularly, people who are overweight, and older adults may need to talk with a health care provider about what activities are safe to do. If you have a medical condition, be sure to talk with your health care provider before you start a new exercise program. What are some exercise ideas? Moderate-intensity exercise ideas include:  Walking 1 mile (1.6 km) in about 15  minutes.  Biking.  Hiking.  Golfing.  Dancing.  Water aerobics. Vigorous-intensity exercise ideas include:  Walking 4.5 miles (7.2 km) or more in about 1 hour.  Jogging or running 5 miles (8 km) in about 1 hour.  Biking 10 miles (16.1 km) or more in about 1 hour.  Lap swimming.  Roller-skating or in-line skating.  Cross-country skiing.  Vigorous competitive sports, such as football, basketball, and soccer.  Jumping rope.  Aerobic dancing. What are some everyday activities that can help me to get exercise?  Yard work, such as: ? Pushing a lawn mower. ? Raking and bagging leaves.  Washing your car.  Pushing a stroller.  Shoveling snow.  Gardening.  Washing windows or floors. How can I be more active in my day-to-day activities?  Use stairs instead of an elevator.  Take a walk during your lunch break.  If you drive, park your car farther away from your work or school.  If you take public transportation, get off one stop early and walk the rest of the way.  Stand up or walk around during all of your indoor phone calls.  Get up, stretch, and walk around every 30 minutes throughout the day.  Enjoy exercise with a friend. Support to continue exercising will help you keep a regular routine of activity. What guidelines can I follow while exercising?  Before you start a new exercise program, talk with your health care provider.  Do not exercise so much that you hurt yourself, feel dizzy, or get very short of breath.  Wear comfortable clothes and wear shoes with good support.  Drink plenty of water while you exercise to prevent dehydration or heat stroke.  Work out until your breathing   and your heartbeat get faster. Where to find more information  U.S. Department of Health and Human Services: www.hhs.gov  Centers for Disease Control and Prevention (CDC): www.cdc.gov Summary  Exercising regularly is important. It will improve your overall fitness,  flexibility, and endurance.  Regular exercise also will improve your overall health. It can help you control your weight, reduce stress, and improve your bone density.  Do not exercise so much that you hurt yourself, feel dizzy, or get very short of breath.  Before you start a new exercise program, talk with your health care provider. This information is not intended to replace advice given to you by your health care provider. Make sure you discuss any questions you have with your health care provider. Document Revised: 01/25/2017 Document Reviewed: 01/03/2017 Elsevier Patient Education  2020 Elsevier Inc.  

## 2019-05-05 NOTE — Assessment & Plan Note (Signed)
Flu vaccine given today  Will obtain lipid panel, BMP, CBC today per patient request.  Encouraged healthy diet and daily exercise  F/u in 1 year

## 2019-05-06 ENCOUNTER — Telehealth: Payer: Self-pay | Admitting: *Deleted

## 2019-05-06 LAB — LIPID PANEL
Chol/HDL Ratio: 4.7 ratio (ref 0.0–5.0)
Cholesterol, Total: 220 mg/dL — ABNORMAL HIGH (ref 100–199)
HDL: 47 mg/dL (ref >39)
LDL Chol Calc (NIH): 137 mg/dL — ABNORMAL HIGH (ref 0–99)
Triglycerides: 203 mg/dL — ABNORMAL HIGH (ref 0–149)
VLDL Cholesterol Cal: 36 mg/dL (ref 5–40)

## 2019-05-06 LAB — BASIC METABOLIC PANEL
BUN/Creatinine Ratio: 13 (ref 9–20)
BUN: 14 mg/dL (ref 6–24)
CO2: 21 mmol/L (ref 20–29)
Calcium: 9.3 mg/dL (ref 8.7–10.2)
Chloride: 107 mmol/L — ABNORMAL HIGH (ref 96–106)
Creatinine, Ser: 1.06 mg/dL (ref 0.76–1.27)
GFR calc Af Amer: 96 mL/min/{1.73_m2} (ref >59)
GFR calc non Af Amer: 83 mL/min/{1.73_m2} (ref >59)
Glucose: 96 mg/dL (ref 65–99)
Potassium: 4.4 mmol/L (ref 3.5–5.2)
Sodium: 141 mmol/L (ref 134–144)

## 2019-05-06 LAB — CBC
Hematocrit: 45.4 % (ref 37.5–51.0)
Hemoglobin: 15.2 g/dL (ref 13.0–17.7)
MCH: 30.8 pg (ref 26.6–33.0)
MCHC: 33.5 g/dL (ref 31.5–35.7)
MCV: 92 fL (ref 79–97)
Platelets: 196 10*3/uL (ref 150–450)
RBC: 4.93 x10E6/uL (ref 4.14–5.80)
RDW: 11.6 % (ref 11.6–15.4)
WBC: 4 10*3/uL (ref 3.4–10.8)

## 2019-05-06 NOTE — Telephone Encounter (Signed)
Pt informed. Lenaya Pietsch, CMA  

## 2019-05-06 NOTE — Telephone Encounter (Signed)
-----   Message from Oralia Manis, DO sent at 05/06/2019  8:40 AM EST ----- Please inform patient that labs are stable. Cholesterol is elevated but he is already on high intensity statin. Would recommend healthy diet and daily exercise.

## 2019-05-25 ENCOUNTER — Ambulatory Visit: Payer: 59 | Attending: Internal Medicine

## 2019-05-25 DIAGNOSIS — Z23 Encounter for immunization: Secondary | ICD-10-CM

## 2019-05-25 NOTE — Progress Notes (Signed)
   Covid-19 Vaccination Clinic  Name:  Logan Black    MRN: [id number] DOB: 16-Oct-1970  05/25/2019  [name] was observed post Covid-19 immunization for 15 minutes without incident. He was provided with Vaccine Information Sheet and instruction to access the V-Safe system.   [name] was instructed to call 911 with any severe reactions post vaccine: Marland Kitchen Difficulty breathing  . Swelling of face and throat  . A fast heartbeat  . A bad rash all over body  . Dizziness and weakness   Immunizations Administered    Name Date Dose VIS Date Route   Pfizer COVID-19 Vaccine 05/25/2019 10:19 AM 0.3 mL 02/06/2019 Intramuscular   Manufacturer: [employer], [employer]   Lot: [id number]   NDC: [address]-7

## 2019-06-08 ENCOUNTER — Ambulatory Visit: Payer: 59

## 2019-06-16 ENCOUNTER — Ambulatory Visit: Payer: 59 | Attending: Internal Medicine

## 2019-06-16 DIAGNOSIS — Z23 Encounter for immunization: Secondary | ICD-10-CM

## 2019-06-16 NOTE — Progress Notes (Signed)
   Covid-19 Vaccination Clinic  Name:  Logan Black    MRN: [id number] DOB: 1970/12/28  06/16/2019  Mr. [name] was observed post Covid-19 immunization for 15 minutes without incident. He was provided with Vaccine Information Sheet and instruction to access the V-Safe system.   Mr. [name] was instructed to call 911 with any severe reactions post vaccine: Marland Kitchen Difficulty breathing  . Swelling of face and throat  . A fast heartbeat  . A bad rash all over body  . Dizziness and weakness   Immunizations Administered    Name Date Dose VIS Date Route   Pfizer COVID-19 Vaccine 06/16/2019 12:31 PM 0.3 mL 04/22/2018 Intramuscular   Manufacturer: [employer], [employer]   Lot: [id number]   NDC: [address]-3

## 2020-11-17 NOTE — Patient Instructions (Addendum)
It was wonderful to see you today.  Please bring ALL of your medications with you to every visit.   Today we talked about:  -You received your Flu shot today. -We are doing tests to check for Hepatitis C and cholesterol. I will let you know the results.   Thank you for choosing [facility].   Please call [phone] with any questions about today's appointment.  Please be sure to schedule follow up at the front  desk before you leave today.   Sabino Dick, DO PGY-2 Family Medicine

## 2020-11-17 NOTE — Progress Notes (Signed)
    SUBJECTIVE:   Patient declined [place] interpreter.  Chief compliant/HPI: annual examination  Logan Black is a 50 y.o. who presents today for an annual exam.   History tabs reviewed and updated . He has a history of "seizure disorder" but has been seizure free for over 10 years without medication. Does not follow with Neurology.  Review of systems form reviewed and negative for chest pain, shortness of breath, abdominal pain, nausea, vomiting, diarrhea, constipation, bloody stools, dysuria, leg swelling, headache, visual changes, ear pain.   OBJECTIVE:   BP 120/87   Pulse 83   Ht 5\' 4"  (1.626 m)   Wt 136 lb 3.2 oz (61.8 kg)   SpO2 100%   BMI 23.38 kg/m   General: Awake, alert, oriented, in no acute distress, pleasant and cooperative with examination HEENT: Normocephalic, atraumatic, nares patent, dentition is good, oropharynx without erythema or exudates, TM's clear bilaterally, no thyroid nodules palpated Cardio: RRR without murmur, 2+ radial, DP and PT pulses b/l Respiratory: CTAB without wheezing/rhonchi/rales Abdomen: Soft, non-tender to palpation of all quadrants, non-distended, no rebound/guarding, no organomegaly MSK: Able to move all extremities spontaneously, good muscle strength, no abnormalities Extremities: without edema or cyanosis Neuro: Speech is clear and intact, no focal deficits, no facial asymmetry, follows commands  Psych: Normal mood and affect   ASSESSMENT/PLAN:   Annual Examination  See AVS for recommendations.  Blood pressure value is at goal.   Considered the following screening exams based upon USPSTF recommendations: Diabetes screening:  not ordered Screening for elevated cholesterol: ordered HIV testing:  previously ordered and normal. Hepatitis C: ordered Hepatitis B: discussed and not ordered Syphilis if at high risk: discussed and and not ordered Reviewed risk factors for latent tuberculosis and not indicated Colorectal cancer  screening: not applicable given age.  if age 53 or over or risk factors.  Immunizations Flu shot today.   Follow up in 1  year or sooner if indicated.    55, DO [facility]

## 2020-11-29 ENCOUNTER — Ambulatory Visit (INDEPENDENT_AMBULATORY_CARE_PROVIDER_SITE_OTHER): Payer: 59 | Admitting: Family Medicine

## 2020-11-29 ENCOUNTER — Encounter: Payer: Self-pay | Admitting: Family Medicine

## 2020-11-29 ENCOUNTER — Other Ambulatory Visit: Payer: Self-pay

## 2020-11-29 VITALS — BP 120/87 | HR 83 | Ht 64.0 in | Wt 136.2 lb

## 2020-11-29 DIAGNOSIS — Z1159 Encounter for screening for other viral diseases: Secondary | ICD-10-CM | POA: Diagnosis not present

## 2020-11-29 DIAGNOSIS — Z0000 Encounter for general adult medical examination without abnormal findings: Secondary | ICD-10-CM

## 2020-11-29 DIAGNOSIS — E782 Mixed hyperlipidemia: Secondary | ICD-10-CM | POA: Diagnosis not present

## 2020-11-29 DIAGNOSIS — Z23 Encounter for immunization: Secondary | ICD-10-CM

## 2020-11-30 LAB — LIPID PANEL
Chol/HDL Ratio: 5.1 ratio — ABNORMAL HIGH (ref 0.0–5.0)
Cholesterol, Total: 238 mg/dL — ABNORMAL HIGH (ref 100–199)
HDL: 47 mg/dL (ref >39)
LDL Chol Calc (NIH): 158 mg/dL — ABNORMAL HIGH (ref 0–99)
Triglycerides: 179 mg/dL — ABNORMAL HIGH (ref 0–149)
VLDL Cholesterol Cal: 33 mg/dL (ref 5–40)

## 2020-11-30 LAB — HCV INTERPRETATION

## 2020-11-30 LAB — HCV AB W REFLEX TO QUANT PCR: HCV Ab: <0.1 s/co ratio (ref 0.0–0.9)

## 2021-12-04 NOTE — Progress Notes (Signed)
    SUBJECTIVE:   Chief compliant/HPI: annual examination  [place] interpreter available for entirety of encounter though patient elected to respond to my questions without interpretation on occasion.   Logan Black is a 51 y.o. who presents today for an annual exam.  Current concerns: None   Works at a [employer]. On his feet all day work walking so he does not do much exercise outside of this. Drinks about twice a week, usually 3-4 beer cans. Does not smoke. Previously (many years ago) states he would occasionally smoke 1 cigarette socially.   Father passed from liver cancer about 13 years ago. No Fhx of cancer otherwise.   History tabs reviewed and updated. Patient has a history of "seizure disorder" but has been seizure-free for over 10 years without anti-epileptics, no longer follows with Neurology.   Review of systems form reviewed and negative for SOB, abdominal pain, bloody stools, urinary issues.   OBJECTIVE:   BP (!) 142/120   Pulse 86   Ht 5\' 4"  (1.626 m)   Wt 135 lb 8 oz (61.5 kg)   SpO2 100%   BMI 23.26 kg/m   General: Awake, alert, oriented, in no acute distress, pleasant and cooperative with examination HEENT: Normocephalic, atraumatic, nares patent, dentition is good, oropharynx without erythema or exudates, TM's clear bilaterally, no thyroid nodules palpated Cardio: RRR without murmur, 2+ radial, DP and PT pulses b/l Respiratory: CTAB without wheezing/rhonchi/rales Abdomen: Soft, non-tender to palpation of all quadrants, non-distended MSK: Able to move all extremities spontaneously, good muscle strength, no abnormalities Extremities: without edema or cyanosis Neuro: Speech is clear and intact, no focal deficits, no facial asymmetry, follows commands  Psych: Normal mood and affect  ASSESSMENT/PLAN:   Annual physical exam Patient is doing well, no complaints. Initial BP elevated, repeat remained elevated. He has had hx of elevated diastolics on chart review.  He is asymptomatic. Would benefit from ambulatory BP monitoring to further assess if hypertensive.  Hx of hyperlipidemia previously on Atorvastatin but has not been on for over a year. Will recheck lipids as listed below. Defer diabetes screening as normal BMI and otherwise healthy without symptoms. Counseled on diet and exercise recommendations. Counseled on alcohol use, limit to 3 drinks if possible.    Annual Examination  See AVS for age appropriate recommendations.  PHQ score 0, reviewed and discussed.  Blood pressure value is not at goal, discussed. Scheduled for ambulatory BP monitoring with Dr. Valentina Lucks tomorrow, 10/18.  Considered the following screening exams based upon USPSTF recommendations: Diabetes screening:  not ordered Screening for elevated cholesterol: discussed and ordered HIV testing:  Low risk. Previously ordered and negative. Hepatitis C:  previously ordered and negative. Low risk  Hepatitis B:  Low risk, not ordered Syphilis if at high risk:  Low risk, not ordered   Reviewed risk factors for latent tuberculosis and not indicated Colorectal cancer screening: discussed, colonoscopy ordered Lung cancer screening:  Non-smoker, not needed.   PSA discussed and after engaging in discussion of possible risks, benefits and complications of screening patient elected to defer.  Influenza vaccine offered and patient amenable.   Follow up in 1 year or sooner if indicated.    Sharion Settler, [place]

## 2021-12-12 ENCOUNTER — Ambulatory Visit (INDEPENDENT_AMBULATORY_CARE_PROVIDER_SITE_OTHER): Payer: 59 | Admitting: Family Medicine

## 2021-12-12 ENCOUNTER — Encounter: Payer: Self-pay | Admitting: Family Medicine

## 2021-12-12 VITALS — BP 142/120 | HR 86 | Ht 64.0 in | Wt 135.5 lb

## 2021-12-12 DIAGNOSIS — Z13220 Encounter for screening for lipoid disorders: Secondary | ICD-10-CM | POA: Diagnosis not present

## 2021-12-12 DIAGNOSIS — Z0000 Encounter for general adult medical examination without abnormal findings: Secondary | ICD-10-CM

## 2021-12-12 DIAGNOSIS — Z1211 Encounter for screening for malignant neoplasm of colon: Secondary | ICD-10-CM | POA: Diagnosis not present

## 2021-12-12 DIAGNOSIS — Z23 Encounter for immunization: Secondary | ICD-10-CM

## 2021-12-12 DIAGNOSIS — E782 Mixed hyperlipidemia: Secondary | ICD-10-CM

## 2021-12-12 NOTE — Patient Instructions (Signed)
It was wonderful to see you today.  Please bring ALL of your medications with you to every visit.   Today we talked about:  Today at your annual preventive visit we talked about the following measures:   I recommend 150 minutes of exercise per week-try 30 minutes 5 days per week We discussed reducing sugary beverages (like soda and juice) and increasing leafy greens and whole fruits.  We discussed avoiding tobacco and alcohol.  I recommend avoiding illicit substances.  Your blood pressure was elevated today. Our goal is <130/80. You have an appointment tomorrow with our pharmacist, Dr. Valentina Lucks to do 24 hour blood pressure monitoring. This will give [place] an idea of your average blood pressures and if you need to start on medications.   You received your Flu shot today. We checked your cholesterol today, I will let you know the results.     Thank you for choosing [facility].   Please call [phone] with any questions about today's appointment.  Please be sure to schedule follow up at the front  desk before you leave today.   Sharion Settler, DO PGY-3 Family Medicine

## 2021-12-12 NOTE — Assessment & Plan Note (Addendum)
Patient is doing well, no complaints. Initial BP elevated, repeat remained elevated. He has had hx of elevated diastolics on chart review. He is asymptomatic. Would benefit from ambulatory BP monitoring to further assess if hypertensive.  Hx of hyperlipidemia previously on Atorvastatin but has not been on for over a year. Will recheck lipids as listed below. Defer diabetes screening as normal BMI and otherwise healthy without symptoms. Counseled on diet and exercise recommendations. Counseled on alcohol use, limit to 3 drinks if possible.

## 2021-12-13 ENCOUNTER — Ambulatory Visit (INDEPENDENT_AMBULATORY_CARE_PROVIDER_SITE_OTHER): Payer: 59 | Admitting: Pharmacist

## 2021-12-13 DIAGNOSIS — I10 Essential (primary) hypertension: Secondary | ICD-10-CM

## 2021-12-13 LAB — LIPID PANEL
Chol/HDL Ratio: 4.4 ratio (ref 0.0–5.0)
Cholesterol, Total: 244 mg/dL — ABNORMAL HIGH (ref 100–199)
HDL: 56 mg/dL (ref >39)
LDL Chol Calc (NIH): 156 mg/dL — ABNORMAL HIGH (ref 0–99)
Triglycerides: 175 mg/dL — ABNORMAL HIGH (ref 0–149)
VLDL Cholesterol Cal: 32 mg/dL (ref 5–40)

## 2021-12-13 NOTE — Progress Notes (Unsigned)
S:     Chief Complaint  Patient presents with   Medication Management    Amb BP Visit   Logan Black is a 51 y.o. male who presents for hypertension evaluation, education, and management.  PMH is significant for seizures (no seizures in past 10 years and not on medication).  Patient was referred and last seen by Primary Care Provider, Dr. Melba Coon, on 12/12/2021.   At last visit, the patients blood pressure was elevated and he was referred for ambulatory blood pressure monitoring.   Discussed procedure for wearing the monitor and gave patient written instructions. Monitor was placed on non-dominant arm with instructions to return in the morning.   Current BP Medications include:  None  Antihypertensives tried in the past include: None  Dietary habits include:  Day #2 - Patient returns to clinic and reports ***. ***Patient reports they were able to wear the Ambulatory Blood Pressure Cuff for the entire 24 evaluation period.   O:  ROS  Physical Exam  Last 3 Office BP readings: BP Readings from Last 3 Encounters:  12/12/21 (!) 142/120  11/29/20 120/87  05/05/19 114/80    Clinical Atherosclerotic Cardiovascular Disease (ASCVD): {YES/NO:21197} The 10-year ASCVD risk score (Arnett DK, et al., 2019) is: 4.9%   Values used to calculate the score:     Age: 70 years     Sex: Male     Is Non-Hispanic African American: No     Diabetic: No     Tobacco smoker: No     Systolic Blood Pressure: 142 mmHg     Is BP treated: No     HDL Cholesterol: 56 mg/dL     Total Cholesterol: 244 mg/dL  Basic Metabolic Panel    Component Value Date/Time   NA 141 05/05/2019 1143   K 4.4 05/05/2019 1143   CL 107 (H) 05/05/2019 1143   CO2 21 05/05/2019 1143   GLUCOSE 96 05/05/2019 1143   GLUCOSE 127 (H) 11/26/2013 0004   BUN 14 05/05/2019 1143   CREATININE 1.06 05/05/2019 1143   CREATININE 1.00 11/27/2012 0952   CALCIUM 9.3 05/05/2019 1143   GFRNONAA 83 05/05/2019 1143   GFRAA 96  05/05/2019 1143    Renal function: CrCl cannot be calculated (Patient's most recent lab result is older than the maximum 21 days allowed.).   ABPM Study Data: Arm Placement {arms; right/left/both:17953:o}  Overall Mean 24hr BP:   *** mmHg  HR: ***  Daytime Mean BP:  *** mmHg  HR: ***  Nighttime Mean BP:  *** mmHg  HR: ***  Dipping Pattern: {yes no:314532}  Sys:   ***   Dia: ***   [normal dipping ~10-20%]  *** For Office Goal Goal BP of <130/80:  ABPM thresholds: Overall BP < 125/75, daytime BP <130/80 mmHg, sleeptime BP <110/65 mmHg   *** For Office Goal Goal BP of <140/90:  ABPM thresholds: Overall BP < 130/80, daytime BP <135/85 mmHg, sleeptime BP <120/70 mmHg   Patient is participating in a Managed Medicaid Plan:  {MM YES/NO:27447::"Yes"}   A/P: History of hypertension since *** found to have white coat hypertension and blood presssure at ***goal.   24-hour ambulatory blood pressure demonstrates *** with an average blood pressure of *** mmHg.  Nocturnal dipping pattern is {Desc; normal/abnormal w/wildcard:19060}.   -Changes to medications  ***.   ***History of hypertension since *** found to have isolated systolic hypertension. 24-hour ambulatory blood pressure demonstrates *** with an average blood pressure of *** mmHg.  Nocturnal dipping pattern is {Desc; normal/abnormal w/wildcard:19060}.   -Changes to medications ***.    Results reviewed and written information provided.    Written patient instructions provided. Patient verbalized understanding of treatment plan.  Total time in face to face counseling *** minutes.    Follow-up:  Pharmacist ***. PCP clinic visit in ***.  Patient seen with ***.

## 2021-12-13 NOTE — Patient Instructions (Addendum)
Nh?t k ho?t ??ng huy?t p Th?i gian N?m/ ?i b? khi ng?/ T?p th? d?c c?ng th?ng/ ?au ??u t?c gi?n/ ?au chng m?t  Hy quay l?i vo lc 8:30 sng ngy mai ?? tho mn hnh. Hy g?i cho Phng khm Y h?c Gia ?nh n?u b?n c b?t k? th?c m?c no tr??c ? [phone]  ?eo my ?o huy?t p Vng bt s? ph?ng ln c? sau 20 pht trong ngy v c? sau 30 pht khi b?n ng?. Ch? s? huy?t p c?a b?n s? [facility] hi?n th? sau khi b?m h?i vng bt ?i?n vo nh?t k ho?t ??ng huy?t p trong ngy, ??c bi?t l trong cc ho?t ??ng c th? ?nh h??ng ??n ch? s? c?a b?n - ch?ng h?n nh? t?p th? d?c, c?ng th?ng, ?i b?, dng thu?c huy?t p  Nh?ng ?i?u quan tr?ng c?n bi?t: Trnh t?t mn hnh trong 24 gi? t?i, tr? khi vi?c ? khi?n b?n kh ch?u ho?c ?au ??n. [facility] lm ??t mn hnh v [facility] lau kh mn hnh b?ng b?t k? s?n ph?m t?y r?a no. [facility] ??t mn hnh ln cnh tay c?a ng??i khc. Khi vng bt ph?ng ln, trnh c? ??ng qu m?c. ?? cnh tay b? cng bung l?ng, h?i cch xa c? th?. Trnh u?n cong cc c? ho?c di chuy?n bn tay/ngn tay. Khi b?n ?i ng?, hy ??m b?o r?ng ?ng m?m khng b? xo?n. Hy nh? ?i?n vo nh?t k ho?t ??ng huy?t p. N?u b?n c?m th?y ?au d? d?i ho?c ?au b?t th??ng (khng lin quan ??n vi?c ki?m tra huy?t p), hy tho my ?o    Blood Pressure Activity Diary Time Lying down/ Sleeping Walking/ Exercise Stressed/ Angry Headache/ Pain Dizzy  9 AM       10 AM       11 AM       12 PM       1 PM       2 PM       Time Lying down/ Sleeping Walking/ Exercise Stressed/ Angry Headache/ Pain Dizzy  3 PM       4 PM        5 PM       6 PM       7 PM       8 PM       Time Lying down/ Sleeping Walking/ Exercise Stressed/ Angry Headache/ Pain Dizzy  9 PM       10 PM       11 PM       12 AM       1 AM       2 AM       3 AM       Time Lying down/ Sleeping Walking/ Exercise Stressed/ Angry Headache/ Pain Dizzy  4 AM       5 AM       6 AM       7 AM       8 AM       9 AM       10 AM        Time you  woke up: _________                  Time you went to sleep:__________  Come back tomorrow at 8:30 AM  to have the monitor removed Call the [facility] if you have any questions before then ([phone])  Wearing the Blood Pressure  Monitor The cuff will inflate every 20 minutes during the day and every 30 minutes while you sleep. Your blood pressure readings will NOT display after cuff inflation Fill out the blood pressure-activity diary during the day, especially during activities that may affect your reading -- such as exercise, stress, walking, taking your blood pressure medications  Important things to know: Avoid taking the monitor off for the next 24 hours, unless it causes you discomfort or pain. Do NOT get the monitor wet and do NOT dry to clean the monitor with any cleaning products. Do NOT put the monitor on anyone else's arm. When the cuff inflates, avoid excess movement. Let the cuffed arm hang loosely, slightly away from the body. Avoid flexing the muscles or moving the hand/fingers. When you go to sleep, make sure that the hose is not kinked. Remember to fill out the blood pressure activity diary. If you experience severe pain or unusual pain (not associated with getting your blood pressure checked), remove the monitor.  Troubleshooting:  Code  Troubleshooting   1  Check cuff position, tighten cuff   2, 3  Remain still during reading   4, 87  Check air hose connections and make sure cuff is tight   85, 89  Check hose connections and make tubing is not crimped   86  Push START/STOP to restart reading   88, 91  Retry by pushing START/STOP   90  Replace batteries. If problem persists, remove monitor and bring back to   clinic at follow up   97, 98, 99  Service required - Remove monitor and bring back to clinic at follow up

## 2021-12-14 ENCOUNTER — Encounter: Payer: Self-pay | Admitting: Pharmacist

## 2021-12-14 DIAGNOSIS — I10 Essential (primary) hypertension: Secondary | ICD-10-CM | POA: Insufficient documentation

## 2021-12-14 MED ORDER — ROSUVASTATIN CALCIUM 10 MG PO TABS: 10.0000 mg | ORAL_TABLET | Freq: Every day | ORAL | 3 refills | Status: DC

## 2021-12-14 MED ORDER — OLMESARTAN MEDOXOMIL-HCTZ 20-12.5 MG PO TABS: 1.0000 | ORAL_TABLET | Freq: Every day | ORAL | 3 refills | Status: DC

## 2021-12-14 NOTE — Assessment & Plan Note (Signed)
History of elevated blood pressures in office for most recent visits.  24-hour ambulatory blood pressure demonstrates consistently elevated awake blood pressure readings with an average awake blood pressure of 148/102 mmHg.  Nocturnal dipping pattern is normal.   - Start medication:  - olmesartan-HCTZ 20-12.5 mg daily

## 2021-12-14 NOTE — Addendum Note (Signed)
Addended by: Sharion Settler on: 12/14/2021 11:29 AM   Modules accepted: Orders

## 2021-12-18 NOTE — Progress Notes (Signed)
Reviewed: I agree with Dr. Koval's documentation and management. 

## 2021-12-21 ENCOUNTER — Telehealth: Payer: Self-pay

## 2021-12-21 DIAGNOSIS — I10 Essential (primary) hypertension: Secondary | ICD-10-CM

## 2021-12-21 MED ORDER — OLMESARTAN MEDOXOMIL-HCTZ 20-12.5 MG PO TABS: 1.0000 | ORAL_TABLET | Freq: Every day | ORAL | 3 refills | Status: DC

## 2021-12-21 NOTE — Telephone Encounter (Signed)
Patients wife calls nurse line in regards to Logan Black.   His insurance only covers medications at [facility].   I have cancelled prescription at [facility] and resent to [facility].

## 2022-01-02 ENCOUNTER — Telehealth: Payer: Self-pay

## 2022-01-02 NOTE — Telephone Encounter (Signed)
Called and discussed with wife. Recommended that patient take Rosuvastatin every other day to see if that helps. Discussed importance of BP medications, recommended that he continue. We can discuss further at his upcoming visit with me.

## 2022-01-02 NOTE — Telephone Encounter (Signed)
Patient's wife calls nurse line regarding medication management. She reports that since starting rosuvastatin, patient has been having increased fatigue.   She also reports that patient has been monitoring BP at home and that he stopped BP medication, as BP readings were in the 120's/80's.   Wife would like to know if patient should decrease dosage on rosuvastatin or try another medication.   Forwarding to prescriber Nita Sells)  Wife is requesting returned call at [phone].  Talbot Grumbling, RN

## 2022-01-22 ENCOUNTER — Ambulatory Visit: Payer: 59 | Admitting: Family Medicine

## 2022-01-30 NOTE — Patient Instructions (Incomplete)
It was wonderful to see you today.  Please bring ALL of your medications with you to every visit.   Today we talked about:  **  Thank you for coming to your visit as scheduled. We have had a large "no-show" problem lately, and this significantly limits our ability to see and care for patients. As a friendly reminder- if you cannot make your appointment please call to cancel. We do have a no show policy for those who do not cancel within 24 hours. Our policy is that if you miss or fail to cancel an appointment within 24 hours, 3 times in a 6-month period, you may be dismissed from our clinic.   Thank you for choosing [facility].   Please call [phone] with any questions about today's appointment.  Please be sure to schedule follow up at the front  desk before you leave today.   Trevino Wyatt, DO PGY-3 Family Medicine   

## 2022-01-30 NOTE — Progress Notes (Deleted)
    SUBJECTIVE:   CHIEF COMPLAINT / HPI:   Blood Pressure F/U Logan Black is a 51 y.o. male who presents to the clinic today for follow up on her blood pressure. He had 24-hour ambulatory blood pressure monitoring on 10/18 which showed an overall mean 24-hour blood pressure of 137/94, with a daytime mean blood pressure of 148/102. He was started on olmesartan-hydrochlorothiazide 20-12.5 mg daily. Reports good*** compliance. Home blood pressure readings range *** systolic and *** diastolic. Denies chest pain, shortness of breath, lower extremity edema, headaches, and vision changes.    PERTINENT  PMH / PSH:  Past Medical History:  Diagnosis Date   History of gastroesophageal reflux (GERD)     OBJECTIVE:   There were no vitals taken for this visit. ***  General: NAD, pleasant, able to participate in exam Cardiac: RRR, no murmurs. Respiratory: CTAB, normal effort, No wheezes, rales or rhonchi Extremities: no edema or cyanosis. Psych: Normal affect and mood  ASSESSMENT/PLAN:   No problem-specific Assessment & Plan notes found for this encounter.      Sabino Dick, DO  [facility]

## 2022-01-31 ENCOUNTER — Ambulatory Visit: Payer: 59 | Admitting: Family Medicine

## 2022-02-06 ENCOUNTER — Ambulatory Visit: Payer: 59 | Admitting: Family Medicine

## 2022-02-06 ENCOUNTER — Other Ambulatory Visit: Payer: Self-pay

## 2022-02-06 VITALS — BP 140/108 | HR 89 | Wt 137.0 lb

## 2022-02-06 DIAGNOSIS — E785 Hyperlipidemia, unspecified: Secondary | ICD-10-CM

## 2022-02-06 DIAGNOSIS — E782 Mixed hyperlipidemia: Secondary | ICD-10-CM

## 2022-02-06 DIAGNOSIS — I10 Essential (primary) hypertension: Secondary | ICD-10-CM | POA: Diagnosis not present

## 2022-02-06 MED ORDER — ROSUVASTATIN CALCIUM 10 MG PO TABS: 10.0000 mg | ORAL_TABLET | Freq: Every day | ORAL | 3 refills | Status: DC

## 2022-02-06 NOTE — Patient Instructions (Signed)
Good to see you today - Thank you for coming in  Things we discussed today:  1) Your blood pressure is still high today (140/108). Your goal blood pressure is less than 140/90. - Take your Benicar HCT daily. This is a safe medication to take every day.  - We will check you blood today to make sure your body is responding ok to the medication  2) Take your Crestor (Rosuvastatin) once a day.  - This lowers your cholesterol, prevents heart attacks and strokes.  - This is safe to take together with your other meds.  Please always bring your medication bottles  Come back to see me in 1-2 weeks.

## 2022-02-06 NOTE — Progress Notes (Unsigned)
    SUBJECTIVE:   CHIEF COMPLAINT / HPI:   PT is a 51yo M h/f HTN f/u. Pt has switched to taking his Benicar HCT every other day because he had home readings that made him scared of dropping it too low. He reports that his home BP this AM was 118/97. He is worried of having to take a daily pill and if was too much medication. I reassured him that this medication is safe to take.  He also reports that he was not taking his rosuvastatin. Denies side effects from it, just worried about having to take a daily pill.  PERTINENT  PMH / PSH: seizure ds, HTN, HLD  OBJECTIVE:   BP (!) 140/108   Pulse 89   Wt 137 lb (62.1 kg)   SpO2 100%   BMI 23.52 kg/m   Gen: Friendly, pleasant man. NAD HEENT: NCAT. MMM Resp: CTAB. Normal WOB on RA. CV: RRR  ASSESSMENT/PLAN:   Hypertension BP above goal today. Reports home reading of 118/97 this AM. He is taking Benicar HCT every other day. I discussed that this is a low and safe dose, his BP still needs more control, and the med works better if used daily. He expressed understanding and agrees to take daily, with close f/u. - Take Benicar HCT daily  - BMP today  HLD (hyperlipidemia) Reports that he has stopped taking his Crestor. Denies side effects, just worried about taking too much meds. I explained that this med was safe to take together with his Benicar. I explained that this med is helpful for decreasing his risk of stroke and heart attack. Pt expressed understanding and wants to restart Crestor. - Refill sent for Crestor 10mg  daily.   , MD [facility]

## 2022-02-07 LAB — BASIC METABOLIC PANEL
BUN/Creatinine Ratio: 12 (ref 9–20)
BUN: 12 mg/dL (ref 6–24)
CO2: 22 mmol/L (ref 20–29)
Calcium: 9.2 mg/dL (ref 8.7–10.2)
Chloride: 105 mmol/L (ref 96–106)
Creatinine, Ser: 0.99 mg/dL (ref 0.76–1.27)
Glucose: 96 mg/dL (ref 70–99)
Potassium: 4.5 mmol/L (ref 3.5–5.2)
Sodium: 140 mmol/L (ref 134–144)
eGFR: 93 mL/min/{1.73_m2} (ref >59)

## 2022-02-07 NOTE — Assessment & Plan Note (Signed)
Reports that he has stopped taking his Crestor. Denies side effects, just worried about taking too much meds. I explained that this med was safe to take together with his Benicar. I explained that this med is helpful for decreasing his risk of stroke and heart attack. Pt expressed understanding and wants to restart Crestor. - Refill sent for Crestor 10mg  daily.

## 2022-02-07 NOTE — Assessment & Plan Note (Signed)
BP above goal today. Reports home reading of 118/97 this AM. He is taking Benicar HCT every other day. I discussed that this is a low and safe dose, his BP still needs more control, and the med works better if used daily. He expressed understanding and agrees to take daily, with close f/u. - Take Benicar HCT daily  - BMP today

## 2022-04-23 ENCOUNTER — Encounter: Payer: Self-pay | Admitting: Gastroenterology

## 2022-05-30 ENCOUNTER — Ambulatory Visit (AMBULATORY_SURGERY_CENTER): Payer: 59 | Admitting: *Deleted

## 2022-05-30 ENCOUNTER — Encounter: Payer: Self-pay | Admitting: Gastroenterology

## 2022-05-30 VITALS — Ht 64.0 in | Wt 136.0 lb

## 2022-05-30 DIAGNOSIS — Z1211 Encounter for screening for malignant neoplasm of colon: Secondary | ICD-10-CM

## 2022-05-30 MED ORDER — NA SULFATE-K SULFATE-MG SULF 17.5-3.13-1.6 GM/177ML PO SOLN: 1.0000 | Freq: Once | ORAL | 0 refills | Status: AC

## 2022-05-30 NOTE — Progress Notes (Signed)
No egg or soy allergy known to patient  No issues known to pt with past sedation with any surgeries or procedures Patient denies ever being told they had issues or difficulty with intubation  No FH of Malignant Hyperthermia Pt is not on diet pills Pt is not on  home 02  Pt is not on blood thinners  Pt denies issues with constipation  No A fib or A flutter Have any cardiac testing pending--NO Pt instructed to use Singlecare.com or GoodRx for a price reduction on prep    [place] interpreter present for pre-visit.  Patient's chart reviewed by Osvaldo Angst CNRA prior to previsit and patient appropriate for the [place].  Previsit completed and red dot placed by patient's name on their procedure day (on provider's schedule).

## 2022-06-12 ENCOUNTER — Encounter: Payer: Self-pay | Admitting: Gastroenterology

## 2022-06-12 ENCOUNTER — Ambulatory Visit (AMBULATORY_SURGERY_CENTER): Payer: 59 | Admitting: Gastroenterology

## 2022-06-12 VITALS — BP 116/72 | HR 80 | Temp 97.5°F | Resp 17 | Ht 64.0 in | Wt 136.0 lb

## 2022-06-12 DIAGNOSIS — D125 Benign neoplasm of sigmoid colon: Secondary | ICD-10-CM | POA: Diagnosis not present

## 2022-06-12 DIAGNOSIS — D123 Benign neoplasm of transverse colon: Secondary | ICD-10-CM | POA: Diagnosis not present

## 2022-06-12 DIAGNOSIS — Z1211 Encounter for screening for malignant neoplasm of colon: Secondary | ICD-10-CM | POA: Diagnosis not present

## 2022-06-12 DIAGNOSIS — I10 Essential (primary) hypertension: Secondary | ICD-10-CM | POA: Diagnosis not present

## 2022-06-12 DIAGNOSIS — K635 Polyp of colon: Secondary | ICD-10-CM | POA: Diagnosis not present

## 2022-06-12 MED ORDER — SODIUM CHLORIDE 0.9 % IV SOLN: 500.0000 mL | Freq: Once | INTRAVENOUS | Status: DC

## 2022-06-12 NOTE — Progress Notes (Signed)
[facility] History and Physical   Primary Care Physician:  Alicia Amel, MD   Reason for Procedure:   Colon cancer screening  Plan:    Screening colonoscopy     HPI: Logan Black is a 52 y.o. male undergoing initial average risk screening colonoscopy.  He has no family history of colon cancer and no chronic GI symptoms.    Past Medical History:  Diagnosis Date   GERD (gastroesophageal reflux disease)    "SOMETIMES,DEPENDS ON WHAT EAT"   History of gastroesophageal reflux (GERD)    Hyperlipidemia    Hypertension    Seizures    2009 LAST SEIZURE UPDATED 05/30/22    Past Surgical History:  Procedure Laterality Date   NO P[AST SURGERY     UPDATED 05/30/22    Prior to Admission medications   Medication Sig Start Date End Date Taking? Authorizing Provider  olmesartan-hydrochlorothiazide (BENICAR HCT) 20-12.5 MG tablet Take 1 tablet by mouth daily. 12/21/21  Yes Alicia Amel, MD  rosuvastatin (CRESTOR) 10 MG tablet Take 1 tablet (10 mg total) by mouth daily. 02/06/22  Yes Lincoln Brigham, MD  omeprazole (PRILOSEC OTC) 20 MG tablet Take 20 mg by mouth every morning.    01/02/11  [provider]    Current Outpatient Medications  Medication Sig Dispense Refill   olmesartan-hydrochlorothiazide (BENICAR HCT) 20-12.5 MG tablet Take 1 tablet by mouth daily. 90 tablet 3   rosuvastatin (CRESTOR) 10 MG tablet Take 1 tablet (10 mg total) by mouth daily. 90 tablet 3   Current Facility-Administered Medications  Medication Dose Route Frequency Provider Last Rate Last Admin   0.9 %  sodium chloride infusion  500 mL Intravenous Once Jenel Lucks, MD        Allergies as of 06/12/2022   (No Known Allergies)    Family History  Problem Relation Age of Onset   Liver cancer Father    Colon cancer Neg Hx    Colon polyps Neg Hx    Crohn's disease Neg Hx    Esophageal cancer Neg Hx    Rectal cancer Neg Hx    Stomach cancer Neg Hx    Ulcerative colitis Neg  Hx     Social History   Socioeconomic History   Marital status: Married    Spouse name: Not on file   Number of children: Not on file   Years of education: Not on file   Highest education level: Not on file  Occupational History   Not on file  Tobacco Use   Smoking status: Never   Smokeless tobacco: Never  Vaping Use   Vaping Use: Never used  Substance and Sexual Activity   Alcohol use: Yes    Alcohol/week: 2.0 standard drinks of alcohol    Types: 2 Shots of liquor per week    Comment: occasionally   Drug use: Never   Sexual activity: Yes  Other Topics Concern   Not on file  Social History Narrative   Works as Agricultural engineer.    Social Determinants of Corporate investment banker Strain: Not on file  Food Insecurity: Not on file  Transportation Needs: Not on file  Physical Activity: Not on file  Stress: Not on file  Social Connections: Not on file  Intimate Partner Violence: Not on file    Review of Systems:  All other review of systems negative except as mentioned in the HPI.  Physical Exam: Vital signs BP 109/78   Pulse 79   Temp Marland Kitchen)  97.5 F (36.4 C)   Ht  (1.626 m)   Wt 136 lb (61.7 kg)   SpO2 100%   BMI 23.34 kg/m   General:   Alert,  Well-developed, well-nourished, pleasant and cooperative in NAD Airway:  Mallampati 2 Lungs:  Clear throughout to auscultation.   Heart:  Regular rate and rhythm; no murmurs, clicks, rubs,  or gallops. Abdomen:  Soft, nontender and nondistended. Normal bowel sounds.   Neuro/Psych:  Normal mood and affect. A and O x 3   [name] E. Tomasa Rand, MD [facility]

## 2022-06-12 NOTE — Patient Instructions (Addendum)
Resume previous diet Continue present medications Await pathology results  Handouts/information given for polyps(2 removed) and diverticulosis   HM NAY QU V? ? TH?C HI?N TH? THU?T N?I SOI T?I TRUNG TM N?I SOI [place]: Vui lng xem bo co th? thu?t ? ???c g?i cho qu v?, n?u qu v? c b?t k? th?c m?c g trong su?t qu trnh th?m khm. N?u bo co th? thu?t khng th? gi?i ?p th?c m?c c?a qu v?, vui lng g?i cho bc s? chuyn khoa tiu ha c?a qu v? ?? ???c gi?i ?p. N?u qu v? ? yu c?u khng cung c?p thng tin chi ti?t v? k?t qu? th? thu?t cho ??i tc ch?m [place] c?a qu v?, th bo co th? thu?t s? ???c g?i trong m?t phong b ???c dn kn ?? qu v? xem khi thu?n ti?n.   QU V? C TH?: C?m gic ch??ng b?ng. Trung ti?n nhi?u h?n bnh th??ng. ?i b? c th? gip ??y ra ngoi khng kh ?i vo ???ng tiu ha trong khi th?c hi?n th? thu?t v gi?m ch??ng b?ng. N?u qu v? ti?n hnh n?i soi d??i (nh? n?i soi ??i trng ho?c soi k?t trng xch-ma b?ng ?ng m?m), qu v? c th? th?y cc ch?m mu ? phn ho?c trn gi?y v? sinh. N?u qu v? ? lm s?ch ??i trng ?? th?c hi?n th? thu?t, qu v? c th? khng ?i ??i ti?n nh? bnh th??ng trong vi ngy.  Vui Lng L?u : Qu v? c th? b? kch ?ng v ngh?t m?i ho?c ch?y n??c m?i. Tnh tr?ng ny l do ?nh h??ng c?a vi?c th? bnh oxy trong qu trnh th?c hi?n th? thu?t. Qu v? khng c?n lo l?ng, tnh tr?ng ny s? bi?n m?t sau m?t ho?c vi ngy.   CC TRI?U CH?NG C?N BO CO NGAY  Sau khi th?c hi?n n?i soi d??i (n?i soi ??i trng ho?c soi k?t trng xch-ma b?ng ?ng m?m): Phn c nhi?u mu ?au b?ng d? d?i ho?c ngy cng t?ng Xu?t hi?n v?t s?ng b?ng m?i, c?p tnh S?t t? 100F tr? ln   Sau khi th?c hi?n n?i soi trn (EGD)  Nn ra mu ho?c ch?t mu c ph s?m Xu?t hi?n c?n ?au ng?c ho?c ?au d??i x??ng b? vai m?i Nu?t ?au ho?c kh nu?t M?i b? kh th? S?t t? 100F tr? ln Phn ?en nh? m?c  ??i v?i cc v?n ?? kh?n c?p ho?c c?p c?u, qu v? c th? lin h? bc s? chuyn khoa tiu  ha b?t k? lc no b?ng cch g?i ??n s? [phone].   CH? ?? ?N U?NG: Chng ti [employer] v? tr??c tin nn ?n nh?, nh?ng sau ? qu v? c th? ?n theo ch? ?? bnh th??ng. U?ng nhi?u n??c nh?ng ph?i trnh ?? u?ng c c?n trong 24 gi?.  HO?T ??NG: Qu v? c?n ln k? ho?ch ?? ngh? ng?i trong ngy hm nay v [facility] NN LI XE ho?c s? d?ng my mc n?ng cho ??n ngy mai (do tc d?ng c?a thu?c an th?n s? d?ng trong th? thu?t).   THEO DI: Nhn vin c?a chng ti s? g?i cho qu v? theo s? ?i?n tho?i trong b?nh n vo ngy lm vi?c ti?p theo sau ngy th?c hi?n th? thu?t ?? ki?m tra tnh tr?ng c?a qu v? v gi?i ?p cc cu h?i ho?c th?c m?c c?a qu v? v? thng tin m qu v? ???c cung c?p sau khi th?c hi?n th? thu?t. N?u chng ti khng lin l?c ???  c v?i qu v?, chng ti s? ?? l?i tin nh?n. Tuy nhin, n?u qu v? c?m th?y kh?e v khng g?p b?t k? s? c? no, qu v? khng c?n g?i l?i cho chng ti. Chng ti s? gi? ??nh r?ng qu v? ? tr? l?i sinh ho?t bnh th??ng v khng g?p b?t k? s? c? no.  N?u qu v? ???c l?y sinh thi?t, chng ti s? lin l?c v?i qu v? qua ?i?n tho?i ho?c th? trong 1-3 tu?n ti?p theo. Vui lng g?i cho chng ti theo s? [phone] n?u qu v? khng nh?n ???c thng tin v? k?t qu? sinh thi?t trong 3 tu?n.  CH? K/B?O M?TLadell Heads v? v/ho?c ??i tc ch?m [place] c?a qu v? ? k vo cc ti li?u s? ???c nh?p vo h? s? y t? ?i?n t? c?a qu v?. Ch? k ny xc nh?n r?ng cc thng tin trn ?y trong B?n Tm T?t Sau Khi Th?m Khm c?a qu v? ? ???c xem xt v hi?u r. Qu v? v/ho?c    YOU HAD AN ENDOSCOPIC PROCEDURE TODAY AT THE [facility]:   Refer to the procedure report that was given to you for any specific questions about what was found during the examination.  If the procedure report does not answer your questions, please call your gastroenterologist to clarify.  If you requested that your care partner not be given the details of your procedure findings, then the procedure report has  been included in a sealed envelope for you to review at your convenience later.  YOU SHOULD EXPECT: Some feelings of bloating in the abdomen. Passage of more gas than usual.  Walking can help get rid of the air that was put into your GI tract during the procedure and reduce the bloating. If you had a lower endoscopy (such as a colonoscopy or flexible sigmoidoscopy) you may notice spotting of blood in your stool or on the toilet paper. If you underwent a bowel prep for your procedure, you may not have a normal bowel movement for a few days.  Please Note:  You might notice some irritation and congestion in your nose or some drainage.  This is from the oxygen used during your procedure.  There is no need for concern and it should clear up in a day or so.  SYMPTOMS TO REPORT IMMEDIATELY:  Following lower endoscopy (colonoscopy):  Excessive amounts of blood in the stool  Significant tenderness or worsening of abdominal pains  Swelling of the abdomen that is new, acute  Fever of 100F or higher For urgent or emergent issues, a gastroenterologist can be reached at any hour by calling [phone]. Do not use MyChart messaging for urgent concerns.   DIET:  We do recommend a small meal at first, but then you may proceed to your regular diet.  Drink plenty of fluids but you should avoid alcoholic beverages for 24 hours.  ACTIVITY:  You should plan to take it easy for the rest of today and you should NOT DRIVE or use heavy machinery until tomorrow (because of the sedation medicines used during the test).    FOLLOW UP: Our staff will call the number listed on your records the next business day following your procedure.  We will call around 7:15- 8:00 am to check on you and address any questions or concerns that you may have regarding the information given to you following your procedure. If we do not reach you, we will leave a message.  If any biopsies were taken you will be contacted by phone or by  letter within the next 1-3 weeks.  Please call us at [phone] if you have not heard about the biopsies in 3 weeks.    SIGNATURES/CONFIDENTIALITY: You and/or your care partner have signed paperwork which will be entered into your electronic medical record.  These signatures attest to the fact that that the information above on your After Visit Summary has been reviewed and is understood.  Full responsibility of the confidentiality of this discharge information lies with you and/or your care-partner.

## 2022-06-12 NOTE — Progress Notes (Signed)
A and O x3. Report to RN. Tolerated MAC anesthesia well. 

## 2022-06-12 NOTE — Op Note (Signed)
[facility] Patient Name: Logan Black Procedure Date: 06/12/2022 8:06 AM MRN: [id number] Endoscopist: Lorin Picket E. Tomasa Rand , MD, [id number] Age: 52 Referring MD:  Date of Birth: 17-Mar-1970 Gender: Male Account #: [id number] Procedure:                Colonoscopy Indications:              Screening for colorectal malignant neoplasm, This                            is the patient's first colonoscopy Medicines:                Monitored Anesthesia Care Procedure:                Pre-Anesthesia Assessment:                           - Prior to the procedure, a History and Physical                            was performed, and patient medications and                            allergies were reviewed. The patient's tolerance of                            previous anesthesia was also reviewed. The risks                            and benefits of the procedure and the sedation                            options and risks were discussed with the patient.                            All questions were answered, and informed consent                            was obtained. Prior Anticoagulants: The patient has                            taken no anticoagulant or antiplatelet agents. ASA                            Grade Assessment: II - A patient with mild systemic                            disease. After reviewing the risks and benefits,                            the patient was deemed in satisfactory condition to                            undergo the procedure.  After obtaining informed consent, the colonoscope                            was passed under direct vision. Throughout the                            procedure, the patient's blood pressure, pulse, and                            oxygen saturations were monitored continuously. The                            Olympus CF-HQ190L ([id number]) Colonoscope was                            introduced through the anus  and advanced to the the                            terminal ileum, with identification of the                            appendiceal orifice and IC valve. The colonoscopy                            was performed without difficulty. The patient                            tolerated the procedure well. The quality of the                            bowel preparation was excellent. The terminal                            ileum, ileocecal valve, appendiceal orifice, and                            rectum were photographed. The bowel preparation                            used was SUPREP via split dose instruction. Scope In: 8:14:19 AM Scope Out: 8:25:40 AM Scope Withdrawal Time: 0 hours 8 minutes 44 seconds  Total Procedure Duration: 0 hours 11 minutes 21 seconds  Findings:                 The perianal and digital rectal examinations were                            normal. Pertinent negatives include normal                            sphincter tone and no palpable rectal lesions.                           A 3 mm polyp was found in the hepatic flexure. The  polyp was flat. The polyp was removed with a cold                            snare. Resection and retrieval were complete.                            Estimated blood loss was minimal.                           A 4 mm polyp was found in the sigmoid colon. The                            polyp was sessile. The polyp was removed with a                            cold snare. Resection and retrieval were complete.                            Estimated blood loss was minimal.                           A few medium-mouthed diverticula were found in the                            ascending colon. There was no evidence of                            diverticular bleeding.                           The exam was otherwise normal throughout the                            examined colon.                           The terminal ileum  appeared normal.                           The retroflexed view of the distal rectum and anal                            verge was normal and showed no anal or rectal                            abnormalities. Complications:            No immediate complications. Estimated Blood Loss:     Estimated blood loss was minimal. Impression:               - One 3 mm polyp at the hepatic flexure, removed                            with a cold snare. Resected and retrieved.                           -  One 4 mm polyp in the sigmoid colon, removed with                            a cold snare. Resected and retrieved.                           - Mild diverticulosis in the ascending colon. There                            was no evidence of diverticular bleeding.                           - The examined portion of the ileum was normal.                           - The distal rectum and anal verge are normal on                            retroflexion view. Recommendation:           - Patient has a contact number available for                            emergencies. The signs and symptoms of potential                            delayed complications were discussed with the                            patient. Return to normal activities tomorrow.                            Written discharge instructions were provided to the                            patient.                           - Resume previous diet.                           - Continue present medications.                           - Await pathology results.                           - Repeat colonoscopy (date not yet determined) for                            surveillance based on pathology results. Samentha Perham E. Tomasa Rand, MD 06/12/2022 8:34:47 AM This report has been signed electronically.

## 2022-06-12 NOTE — Progress Notes (Signed)
Pt's states no medical or surgical changes since previsit or office visit. 

## 2022-06-12 NOTE — Progress Notes (Signed)
Called to room to assist during endoscopic procedure.  Patient ID and intended procedure confirmed with present staff. Received instructions for my participation in the procedure from the performing physician.  

## 2022-06-13 ENCOUNTER — Telehealth: Payer: Self-pay | Admitting: *Deleted

## 2022-06-13 NOTE — Telephone Encounter (Signed)
  Follow up Call-     06/12/2022    7:57 AM  Call back number  Post procedure Call Back phone  # [phone]  Permission to leave phone message Yes     Patient questions:  Do you have a fever, pain , or abdominal swelling? No. Pain Score  0 *  Have you tolerated food without any problems? Yes.    Have you been able to return to your normal activities? Yes.    Do you have any questions about your discharge instructions: Diet   No. Medications  No. Follow up visit  No.  Do you have questions or concerns about your Care? No.  Actions: * If pain score is 4 or above: No action needed, pain <4.

## 2022-06-18 ENCOUNTER — Encounter: Payer: Self-pay | Admitting: Gastroenterology

## 2022-12-16 NOTE — Progress Notes (Deleted)
    SUBJECTIVE:   Chief compliant/HPI: annual examination  Torrey Piccini is a 52 y.o. who presents today for an annual exam.   History tabs reviewed and updated ***.   Review of systems form reviewed and notable for ***.   OBJECTIVE:   There were no vitals taken for this visit.  ***  ASSESSMENT/PLAN:   No problem-specific Assessment & Plan notes found for this encounter.    Annual Examination  See AVS for age appropriate recommendations.  PHQ score ***, reviewed and discussed.  Blood pressure value is *** goal, discussed.   Considered the following screening exams based upon USPSTF recommendations: Diabetes screening: {discussed/ordered:14545} Screening for elevated cholesterol: {discussed/ordered:14545} HIV testing: {discussed/ordered:14545} Hepatitis C: {discussed/ordered:14545} Hepatitis B: {discussed/ordered:14545} Syphilis if at high risk: {discussed/ordered:14545} Reviewed risk factors for latent tuberculosis and {not indicated/requested/declined:14582} Colorectal cancer screening: {crcscreen:23821::"discussed, colonoscopy ordered"} Lung cancer screening: {discussed/declined/written NUUV:25366} See documentation below regarding discussion and indication.  PSA discussed and after engaging in discussion of possible risks, benefits and complications of screening patient elected to ***.   Follow up in 1 year or sooner if indicated.    Celine Mans, MD [facility]

## 2022-12-17 ENCOUNTER — Encounter: Payer: 59 | Admitting: Family Medicine

## 2022-12-19 ENCOUNTER — Ambulatory Visit (INDEPENDENT_AMBULATORY_CARE_PROVIDER_SITE_OTHER): Payer: 59 | Admitting: Family Medicine

## 2022-12-19 ENCOUNTER — Encounter: Payer: Self-pay | Admitting: Family Medicine

## 2022-12-19 VITALS — BP 119/82 | HR 94 | Ht 64.0 in | Wt 140.2 lb

## 2022-12-19 DIAGNOSIS — Z0000 Encounter for general adult medical examination without abnormal findings: Secondary | ICD-10-CM

## 2022-12-19 DIAGNOSIS — Z23 Encounter for immunization: Secondary | ICD-10-CM

## 2022-12-19 DIAGNOSIS — E782 Mixed hyperlipidemia: Secondary | ICD-10-CM | POA: Diagnosis not present

## 2022-12-19 DIAGNOSIS — I10 Essential (primary) hypertension: Secondary | ICD-10-CM

## 2022-12-19 MED ORDER — ROSUVASTATIN CALCIUM 10 MG PO TABS: 10.0000 mg | ORAL_TABLET | Freq: Every day | ORAL | 3 refills | Status: DC

## 2022-12-19 MED ORDER — OLMESARTAN MEDOXOMIL-HCTZ 20-12.5 MG PO TABS
1.0000 | ORAL_TABLET | Freq: Every day | ORAL | 3 refills | Status: DC
Start: 2022-12-19 — End: 2024-01-07

## 2022-12-19 NOTE — Assessment & Plan Note (Signed)
Received flu shot today Declined shingles and Tdap at this time, but may get this at a pharmacy A1c checked today, last was 5.5 in 2019 but he does have other chronic conditions

## 2022-12-19 NOTE — Assessment & Plan Note (Signed)
LDL 153 11/2021, will recheck lipid panel today Goal LDL <100 Continue rosuvastatin 10 mg for now, consider adjustment based off of today's results

## 2022-12-19 NOTE — Progress Notes (Signed)
    SUBJECTIVE:   CHIEF COMPLAINT / HPI:   Presents for his annual visit today.  Has no concerns or complaints at this time.  Has been taking his rosuvastatin and Benicar every day, no side effects or concerns regarding his medications.  PERTINENT  PMH / PSH: Hypertension, hyperlipidemia, distant history of seizure  Annual Examination  See AVS for age appropriate recommendations.  PHQ score 0, reviewed and discussed.  Blood pressure value is at goal, discussed.   Considered the following screening exams based upon USPSTF recommendations: Diabetes screening: ordered Screening for elevated cholesterol: ordered HIV testing: discussed, low risk, negative in the past Hepatitis C: discussed, low risk, negative in the past Hepatitis B: discussed, low risk, negative in the past Syphilis if at high risk: Low risk Colorectal cancer screening: up to date on screening for CRC. Lung cancer screening: discussed, patient has never smoked  Follow up in 1 year or sooner if indicated.   OBJECTIVE:   BP 119/82   Pulse 94   Ht 5\' 4"  (1.626 m)   Wt 140 lb 4 oz (63.6 kg)   SpO2 100%   BMI 24.07 kg/m   GEN: Well-appearing, no acute distress CV: Regular rate and rhythm, no murmurs Respiratory: Clear to auscultation bilaterally no increased work of breathing on room air  ASSESSMENT/PLAN:   Hypertension Stable, continue Benicar BMP obtained today  HLD (hyperlipidemia) LDL 153 11/2021, will recheck lipid panel today Goal LDL <100 Continue rosuvastatin 10 mg for now, consider adjustment based off of today's results  Healthcare maintenance Received flu shot today Declined shingles and Tdap at this time, but may get this at a pharmacy A1c checked today, last was 5.5 in 2019 but he does have other chronic conditions     Para March, DO [facility]

## 2022-12-19 NOTE — Assessment & Plan Note (Signed)
Stable, continue Benicar BMP obtained today

## 2022-12-19 NOTE — Patient Instructions (Addendum)
It was great to see you today! Thank you for choosing [facility] for your primary care. Logan Black was seen for annual physical.  Today we addressed: We are checking your cholesterol, A1c, and kidney function today.  I will call you if these results are abnormal.  Please continue taking your cholesterol and blood pressure medication. Consider going to a pharmacy and getting your shingles vaccination.  You are also due for a tetanus shot that you can get at a pharmacy or come back to the clinic. We gave you your flu shot today.  If you haven't already, sign up for My Chart to have easy access to your labs results, and communication with your primary care physician.  We are checking some labs today. If they are abnormal, I will call you. If they are normal, I will send you a MyChart message (if it is active) or a letter in the mail. If you do not hear about your labs in the next 2 weeks, please call the office.  You should return to our clinic Return in about 1 year (around 12/19/2023) for annual. Please arrive 15 minutes before your appointment to ensure smooth check in process.  We appreciate your efforts in making this happen.  Thank you for allowing me to participate in your care, Para March, DO 12/19/2022, 10:06 AM PGY-1, [facility]

## 2022-12-20 ENCOUNTER — Encounter: Payer: Self-pay | Admitting: Family Medicine

## 2022-12-20 LAB — BASIC METABOLIC PANEL
BUN/Creatinine Ratio: 15 (ref 9–20)
BUN: 15 mg/dL (ref 6–24)
CO2: 24 mmol/L (ref 20–29)
Calcium: 9.6 mg/dL (ref 8.7–10.2)
Chloride: 104 mmol/L (ref 96–106)
Creatinine, Ser: 0.97 mg/dL (ref 0.76–1.27)
Glucose: 92 mg/dL (ref 70–99)
Potassium: 4.5 mmol/L (ref 3.5–5.2)
Sodium: 141 mmol/L (ref 134–144)
eGFR: 95 mL/min/{1.73_m2} (ref >59)

## 2022-12-20 LAB — HEMOGLOBIN A1C
Est. average glucose Bld gHb Est-mCnc: 128 mg/dL
Hgb A1c MFr Bld: 6.1 % — ABNORMAL HIGH (ref 4.8–5.6)

## 2022-12-20 LAB — LIPID PANEL
Chol/HDL Ratio: 2.7 ratio (ref 0.0–5.0)
Cholesterol, Total: 167 mg/dL (ref 100–199)
HDL: 61 mg/dL (ref >39)
LDL Chol Calc (NIH): 79 mg/dL (ref 0–99)
Triglycerides: 156 mg/dL — ABNORMAL HIGH (ref 0–149)
VLDL Cholesterol Cal: 27 mg/dL (ref 5–40)

## 2023-12-04 DIAGNOSIS — R7303 Prediabetes: Secondary | ICD-10-CM | POA: Insufficient documentation

## 2023-12-04 NOTE — Assessment & Plan Note (Signed)
 Borderline elevated diastolic in clinic today.  No indication for medication changes at this time. - Patient to evaluate blood pressure at home over the next 2 to 3 weeks call clinic with results - Continue Benicar  HCT 20-12.5 mg - Labs: BMP

## 2023-12-04 NOTE — Progress Notes (Unsigned)
 SUBJECTIVE:   Chief compliant/HPI: annual examination  Logan Black is a 53 y.o. who presents today for an annual exam.   Concerns include: None Checks BP at home in the morning (reports this is around 110s/80s). Review of systems form reviewed and notable for none.   PMHx of: HTN, HLD, prediabetes, history of seizure  Diet: 3 meals a day, sometimes home, sometimes out, good balance; occasional desserts; cofre in the morning (little); occasional sodas Exercise: 40 minutes of walking a day Sleep: bedtime 12pm-7/8am; feels well rested, no issues Mood: Overall happy Sees a dentist sometimes.  Social Hx Alcohol use: Two times a week, drinks 2-3 beers Tobacco use: None Substance use: None Relationship: Yes (wife), feels safe in this relationship Sexually active: With wife only, no concern for STI (does not want testing at this time) Job/Education: Owns a nail salon  Healthcare Maintenance: - Flu vaccine - got today - COVID booster - declined - Tdap booster - get at outpatient pharmacy - Hep B vaccine - Pneumococcal vaccine - Shingrix vaccine - get at outpatient pharmacy  History tabs reviewed and updated.   OBJECTIVE:   BP 119/89   Pulse 84   Temp (!) 97.3 F (36.3 C)   Ht 5' 4 (1.626 m)   Wt 145 lb 3.2 oz (65.9 kg)   SpO2 100%   BMI 24.92 kg/m    HEENT: PERRL, EOMI, conjunctiva non injected bilaterally. Nares patent bilaterally. MMM, oropharynx clear, no erythema or exudate. Neck: Supple, no cervical lymphadenopathy.  Thyroid without nodules Cardiovascular: Regular rate and rhythm, no murmurs/rubs/gallops. Respiratory: Normal work of breathing on room air. Clear to auscultation bilaterally; no wheezes, crackles. Abdomen: Bowel sounds present and normoactive bilaterally. Soft, nondistended, nontender. Extremities: Skin warm, dry. No bilateral lower extremity edema.  ASSESSMENT/PLAN:   Assessment & Plan Healthcare maintenance Encounter for  immunization Overall doing well. - Given flu shot today Prediabetes A1c of 5.8.  No indication for medication at this time - Continue lifestyle management with diet and exercise Mixed hyperlipidemia - Continue Crestor  10 mg daily - Labs: Lipid Panel Hypertension, unspecified type Borderline elevated diastolic in clinic today.  No indication for medication changes at this time. - Patient to evaluate blood pressure at home over the next 2 to 3 weeks call clinic with results - Continue Benicar  HCT 20-12.5 mg - Labs: BMP  Annual Examination  See AVS for age appropriate recommendations.  Blood pressure value is at goal, discussed as above.   Considered the following screening exams based upon USPSTF recommendations: Diabetes screening: ordered HIV testing: Previously done, normal, no need for repeat at this time Hepatitis C: Previously done, normal, no need for repeat at this time Hepatitis B:Not ordered at this time Syphilis if at high risk: Not high risk, not indicated at this time GC/CT Not high risk, not indicated at this time Lipid panel (nonfasting or fasting) discussed based upon AHA recommendations and ordered.  Consider repeat every 4-6 years.  Reviewed risk factors for latent tuberculosis and not indicated  Cancer Screening Discussion  Lung cancer screening:not indicated as does not meet criteria.  See documentation below regarding indications/risks/benefits.  Colorectal cancer screening: up to date on screening for CRC..  PSA discussed and after engaging in discussion of possible risks, benefits and complications of screening. PSA: not ordered at this time Vaccinations: Flu shot given today.   Follow up in 1 year or sooner if indicated.  MyChart Activation: Signed up today   Alan Flies, MD [facility]

## 2023-12-04 NOTE — Assessment & Plan Note (Signed)
-   Continue Crestor  10 mg daily - Labs: Lipid Panel

## 2023-12-04 NOTE — Assessment & Plan Note (Signed)
 A1c of 5.8.  No indication for medication at this time - Continue lifestyle management with diet and exercise

## 2023-12-05 ENCOUNTER — Ambulatory Visit (INDEPENDENT_AMBULATORY_CARE_PROVIDER_SITE_OTHER)

## 2023-12-05 VITALS — BP 119/89 | HR 84 | Temp 97.3°F | Ht 64.0 in | Wt 145.2 lb

## 2023-12-05 DIAGNOSIS — R7303 Prediabetes: Secondary | ICD-10-CM | POA: Diagnosis not present

## 2023-12-05 DIAGNOSIS — Z0000 Encounter for general adult medical examination without abnormal findings: Secondary | ICD-10-CM

## 2023-12-05 DIAGNOSIS — I10 Essential (primary) hypertension: Secondary | ICD-10-CM

## 2023-12-05 DIAGNOSIS — Z23 Encounter for immunization: Secondary | ICD-10-CM | POA: Diagnosis not present

## 2023-12-05 DIAGNOSIS — E782 Mixed hyperlipidemia: Secondary | ICD-10-CM | POA: Diagnosis not present

## 2023-12-05 LAB — POCT GLYCOSYLATED HEMOGLOBIN (HGB A1C): Hemoglobin A1C: 5.8 % — AB (ref 4.0–5.6)

## 2023-12-05 NOTE — Patient Instructions (Addendum)
 Logan Black,   It was great seeing you in clinic today! You came in for your yearly check-up. You are doing very well overall! Keep up the good work with your diet and your regular exercise.  There are a few things for you to do outside of clinic: - I am getting some labs today; results will be availably in your MyChart later this week, and I will reach out if there is anything abnormal - Please check your blood pressure at different times of the day, 2-3 times a week for the next 2-3 weeks and write down the numbers; please then call the clinic and let us  know these numbers  Please bring all your medications to your next office visit.  Thank you for allowing me to be a part of your care team! Alan Flies, MD [facility] [address] [place], [place], [place] [address] [phone]

## 2023-12-05 NOTE — Assessment & Plan Note (Signed)
 Overall doing well. - Given flu shot today

## 2023-12-06 ENCOUNTER — Ambulatory Visit: Payer: Self-pay

## 2023-12-06 LAB — LIPID PANEL
Chol/HDL Ratio: 3.2 ratio (ref 0.0–5.0)
Cholesterol, Total: 161 mg/dL (ref 100–199)
HDL: 50 mg/dL (ref >39)
LDL Chol Calc (NIH): 82 mg/dL (ref 0–99)
Triglycerides: 171 mg/dL — ABNORMAL HIGH (ref 0–149)
VLDL Cholesterol Cal: 29 mg/dL (ref 5–40)

## 2023-12-06 LAB — BASIC METABOLIC PANEL WITH GFR
BUN/Creatinine Ratio: 18 (ref 9–20)
BUN: 17 mg/dL (ref 6–24)
CO2: 21 mmol/L (ref 20–29)
Calcium: 9.3 mg/dL (ref 8.7–10.2)
Chloride: 103 mmol/L (ref 96–106)
Creatinine, Ser: 0.95 mg/dL (ref 0.76–1.27)
Glucose: 99 mg/dL (ref 70–99)
Potassium: 4.4 mmol/L (ref 3.5–5.2)
Sodium: 140 mmol/L (ref 134–144)
eGFR: 96 mL/min/1.73 (ref >59)

## 2023-12-06 NOTE — Progress Notes (Signed)
 Called patient to discuss results of recent labs.  Informed him that his metabolic panel is overall good with good electrolyte and kidney function.  For his lipid panel, his cholesterol and LDL are under good control on current regimen.  Triglycerides are still high, but given good control over other aspects of life and overall low ASCVD risk, no indication for medication at this time.  Encouraged to continue lifestyle management with diet and exercise.

## 2024-01-06 ENCOUNTER — Other Ambulatory Visit: Payer: Self-pay | Admitting: Family Medicine

## 2024-01-06 DIAGNOSIS — E782 Mixed hyperlipidemia: Secondary | ICD-10-CM

## 2024-01-06 DIAGNOSIS — I10 Essential (primary) hypertension: Secondary | ICD-10-CM
